# Patient Record
Sex: Male | Born: 1945 | Race: White | Hispanic: No | Marital: Married | Smoking: Former smoker
Health system: Southern US, Academic
[De-identification: ages and names within clinical notes are randomized; demographics above are authoritative.]

## PROBLEM LIST (undated history)

## (undated) DIAGNOSIS — G8929 Other chronic pain: Secondary | ICD-10-CM

## (undated) DIAGNOSIS — K269 Duodenal ulcer, unspecified as acute or chronic, without hemorrhage or perforation: Secondary | ICD-10-CM

## (undated) DIAGNOSIS — Z87898 Personal history of other specified conditions: Secondary | ICD-10-CM

## (undated) DIAGNOSIS — M549 Dorsalgia, unspecified: Secondary | ICD-10-CM

## (undated) DIAGNOSIS — I341 Nonrheumatic mitral (valve) prolapse: Secondary | ICD-10-CM

## (undated) DIAGNOSIS — I4891 Unspecified atrial fibrillation: Secondary | ICD-10-CM

## (undated) HISTORY — PX: HX HERNIA REPAIR: SHX51

## (undated) HISTORY — DX: Other chronic pain: G89.29

## (undated) HISTORY — PX: LAMINECTOMY: SHX219

## (undated) HISTORY — DX: Personal history of other specified conditions: Z87.898

## (undated) HISTORY — DX: Duodenal ulcer, unspecified as acute or chronic, without hemorrhage or perforation: K26.9

## (undated) HISTORY — DX: Unspecified atrial fibrillation (CMS HCC): I48.91

## (undated) HISTORY — PX: HX VASECTOMY: SHX75

## (undated) HISTORY — DX: Dorsalgia, unspecified: M54.9

---

## 2000-04-01 ENCOUNTER — Inpatient Hospital Stay (HOSPITAL_COMMUNITY): Payer: Self-pay

## 2007-08-15 HISTORY — PX: COLONOSCOPY: WVUENDOPRO10

## 2015-12-30 ENCOUNTER — Other Ambulatory Visit (FREE_STANDING_LABORATORY_FACILITY): Payer: Self-pay | Admitting: EXTERNAL

## 2015-12-30 ENCOUNTER — Encounter (FREE_STANDING_LABORATORY_FACILITY)
Admit: 2015-12-30 | Discharge: 2015-12-30 | Disposition: A | Discharge status: Home / Self Care | Payer: Self-pay | Attending: EXTERNAL | Admitting: EXTERNAL

## 2015-12-31 ENCOUNTER — Inpatient Hospital Stay (HOSPITAL_COMMUNITY): Admitting: CRITICAL CARE

## 2015-12-31 ENCOUNTER — Encounter (HOSPITAL_COMMUNITY)
Admission: EM | Disposition: A | Discharge status: Home / Self Care | Payer: Self-pay | Source: Other Acute Inpatient Hospital | Attending: Internal Medicine

## 2015-12-31 ENCOUNTER — Inpatient Hospital Stay
Admission: EM | Admit: 2015-12-31 | Discharge: 2016-01-04 | DRG: 378 | Disposition: A | Discharge status: Home / Self Care | Payer: Medicare Other | Source: Other Acute Inpatient Hospital | Attending: Internal Medicine | Admitting: Internal Medicine

## 2015-12-31 ENCOUNTER — Inpatient Hospital Stay (HOSPITAL_COMMUNITY): Payer: Medicare Other

## 2015-12-31 DIAGNOSIS — Z4682 Encounter for fitting and adjustment of non-vascular catheter: Secondary | ICD-10-CM

## 2015-12-31 DIAGNOSIS — G8929 Other chronic pain: Secondary | ICD-10-CM | POA: Diagnosis present

## 2015-12-31 DIAGNOSIS — F0390 Unspecified dementia without behavioral disturbance: Secondary | ICD-10-CM | POA: Diagnosis present

## 2015-12-31 DIAGNOSIS — D62 Acute posthemorrhagic anemia: Secondary | ICD-10-CM

## 2015-12-31 DIAGNOSIS — K922 Gastrointestinal hemorrhage, unspecified: Secondary | ICD-10-CM

## 2015-12-31 DIAGNOSIS — D6959 Other secondary thrombocytopenia: Secondary | ICD-10-CM | POA: Diagnosis present

## 2015-12-31 DIAGNOSIS — K264 Chronic or unspecified duodenal ulcer with hemorrhage: Principal | ICD-10-CM | POA: Diagnosis present

## 2015-12-31 DIAGNOSIS — F329 Major depressive disorder, single episode, unspecified: Secondary | ICD-10-CM | POA: Diagnosis present

## 2015-12-31 DIAGNOSIS — J929 Pleural plaque without asbestos: Secondary | ICD-10-CM

## 2015-12-31 DIAGNOSIS — K260 Acute duodenal ulcer with hemorrhage: Secondary | ICD-10-CM

## 2015-12-31 DIAGNOSIS — E876 Hypokalemia: Secondary | ICD-10-CM | POA: Diagnosis not present

## 2015-12-31 DIAGNOSIS — E785 Hyperlipidemia, unspecified: Secondary | ICD-10-CM | POA: Diagnosis present

## 2015-12-31 DIAGNOSIS — I471 Supraventricular tachycardia: Secondary | ICD-10-CM | POA: Diagnosis not present

## 2015-12-31 DIAGNOSIS — Z87891 Personal history of nicotine dependence: Secondary | ICD-10-CM

## 2015-12-31 DIAGNOSIS — I341 Nonrheumatic mitral (valve) prolapse: Secondary | ICD-10-CM | POA: Diagnosis present

## 2015-12-31 DIAGNOSIS — D649 Anemia, unspecified: Secondary | ICD-10-CM | POA: Diagnosis present

## 2015-12-31 DIAGNOSIS — R079 Chest pain, unspecified: Secondary | ICD-10-CM

## 2015-12-31 DIAGNOSIS — N400 Enlarged prostate without lower urinary tract symptoms: Secondary | ICD-10-CM

## 2015-12-31 DIAGNOSIS — M549 Dorsalgia, unspecified: Secondary | ICD-10-CM | POA: Diagnosis present

## 2015-12-31 DIAGNOSIS — K567 Ileus, unspecified: Secondary | ICD-10-CM | POA: Diagnosis not present

## 2015-12-31 DIAGNOSIS — Z791 Long term (current) use of non-steroidal anti-inflammatories (NSAID): Secondary | ICD-10-CM

## 2015-12-31 HISTORY — DX: Nonrheumatic mitral (valve) prolapse: I34.1

## 2015-12-31 LAB — VENOUS BLOOD GAS/LACTATE
%FIO2 (VENOUS): 40 %
%FIO2 (VENOUS): 30 %
%FIO2 (VENOUS): 30 %
BASE DEFICIT: 0.2 mmol/L (ref ?–3.0)
BASE EXCESS: 1 mmol/L (ref ?–3.0)
BASE EXCESS: 2.2 mmol/L (ref ?–3.0)
BICARBONATE (VENOUS): 0 mmol/L — ABNORMAL LOW (ref 22.0–26.0)
BICARBONATE (VENOUS): 26.2 mmol/L — ABNORMAL HIGH (ref 22.0–26.0)
LACTATE: 1.4 mmol/L — ABNORMAL HIGH (ref 0.0–1.3)
LACTATE: 1.1 mmol/L (ref 0.0–1.3)
PCO2 (VENOUS): 48 mmHg (ref 41.00–51.00)
PCO2 (VENOUS): 33 mmHg — ABNORMAL LOW (ref 41.00–51.00)
PH (VENOUS): 7.36 (ref 7.31–7.41)
PH (VENOUS): 7.49 — ABNORMAL HIGH (ref 7.31–7.41)
PO2 (VENOUS): 18 mmHg — CL (ref 35.0–50.0)
PO2 (VENOUS): 42 mmHg (ref 35.0–50.0)

## 2015-12-31 LAB — H & H
HCT: 24.7 % — ABNORMAL LOW (ref 36.7–47.0)
HGB: 8.5 g/dL — ABNORMAL LOW (ref 12.5–16.3)

## 2015-12-31 LAB — CBC WITH DIFF
BASOPHIL #: 0.04 x10ˆ3/uL (ref 0.00–0.20)
BASOPHIL %: 1 %
BASOPHIL %: 1 %
EOSINOPHIL #: 0.24 x10ˆ3/uL (ref 0.00–0.50)
EOSINOPHIL %: 3 %
HCT: 16.9 % — ABNORMAL LOW (ref 36.7–47.0)
HCT: 25.7 % — ABNORMAL LOW (ref 36.7–47.0)
HGB: 5.9 g/dL — CL (ref 12.5–16.3)
HGB: 8.9 g/dL — ABNORMAL LOW (ref 12.5–16.3)
LYMPHOCYTE #: 1.84 x10ˆ3/uL (ref 1.00–4.80)
LYMPHOCYTE %: 22 %
MCH: 30 pg (ref 27.4–33.0)
MCH: 29.5 pg (ref 27.4–33.0)
MCH: 29.5 pg (ref 27.4–33.0)
MCHC: 34.9 g/dL (ref 32.5–35.8)
MCHC: 34.6 g/dL (ref 32.5–35.8)
MCV: 86.2 fL (ref 78.0–100.0)
MCV: 85.4 fL (ref 78.0–100.0)
MONOCYTE #: 0.75 x10?3/uL (ref 0.30–1.00)
MONOCYTE %: 9 %
MPV: 10 fL (ref 7.5–11.5)
MPV: 8.7 fL (ref 7.5–11.5)
NEUTROPHIL #: 5.48 x10ˆ3/uL (ref 1.50–7.70)
NEUTROPHIL %: 66 %
PLATELETS: 53 x10ˆ3/uL — ABNORMAL LOW (ref 140–450)
PLATELETS: 85 x10ˆ3/uL — ABNORMAL LOW (ref 140–450)
RBC: 1.97 x10ˆ6/uL — ABNORMAL LOW (ref 4.06–5.63)
RBC: 3.01 10*6/uL — ABNORMAL LOW (ref 4.06–5.63)
RBC: 3.01 x10ˆ6/uL — ABNORMAL LOW (ref 4.06–5.63)
RDW: 14.9 % (ref 12.0–15.0)
RDW: 14.5 % (ref 12.0–15.0)
WBC: 7.4 x10ˆ3/uL (ref 3.5–11.0)
WBC: 8.3 x10ˆ3/uL (ref 3.5–11.0)

## 2015-12-31 LAB — MANUAL DIFFERENTIAL (CELLAVISION)
BANDS: 1 % (ref 0–15)
BASOPHIL #: 0 x10ˆ3/uL (ref 0.00–0.20)
BASOPHIL %: 0 %
EOSINOPHIL #: 0 x10ˆ3/uL (ref 0.00–0.50)
EOSINOPHIL %: 0 %
LYMPHOCYTE #: 0.64 x10ˆ3/uL — ABNORMAL LOW (ref 1.00–4.80)
LYMPHOCYTE %: 9 %
MONOCYTE #: 0 x10ˆ3/uL — ABNORMAL LOW (ref 0.30–1.00)
MONOCYTE %: 0 %
NEUTROPHIL #: 6.76 x10ˆ3/uL (ref 1.50–7.70)
NEUTROPHIL %: 91 %

## 2015-12-31 LAB — PHOSPHORUS: PHOSPHORUS: 2.1 mg/dL — ABNORMAL LOW (ref 2.3–4.0)

## 2015-12-31 LAB — HEPATIC FUNCTION PANEL
ALBUMIN: 2.6 g/dL — ABNORMAL LOW (ref 3.4–4.8)
ALKALINE PHOSPHATASE: 25 U/L (ref <150)
ALT (SGPT): 15 U/L (ref <55)
AST (SGOT): 24 U/L (ref 8–48)
BILIRUBIN DIRECT: 0.5 mg/dL — ABNORMAL HIGH (ref <0.3)
BILIRUBIN TOTAL: 1.1 mg/dL (ref 0.3–1.3)
PROTEIN TOTAL: 4 g/dL — ABNORMAL LOW (ref 6.0–8.0)

## 2015-12-31 LAB — VENOUS BLOOD GAS
%FIO2 (VENOUS): 40 %
BASE DEFICIT: 1.9 mmol/L (ref ?–3.0)
BICARBONATE (VENOUS): 21.7 mmol/L — ABNORMAL LOW (ref 22.0–26.0)
PCO2 (VENOUS): 35 mmHg — ABNORMAL LOW (ref 41.00–51.00)
PH (VENOUS): 7.41 (ref 7.31–7.41)
PO2 (VENOUS): 20 mmHg — ABNORMAL LOW (ref 35.0–50.0)

## 2015-12-31 LAB — BASIC METABOLIC PANEL
ANION GAP: 5 mmol/L (ref 4–13)
BUN/CREA RATIO: 34 — ABNORMAL HIGH (ref 6–22)
BUN: 26 mg/dL — ABNORMAL HIGH (ref 8–25)
CALCIUM: 7.4 mg/dL — ABNORMAL LOW (ref 8.5–10.4)
CHLORIDE: 112 mmol/L — ABNORMAL HIGH (ref 96–111)
CO2 TOTAL: 23 mmol/L (ref 22–32)
CREATININE: 0.76 mg/dL (ref 0.62–1.27)
ESTIMATED GFR: >59 mL/min/1.73mˆ2 (ref >59)
GLUCOSE: 137 mg/dL (ref 65–139)
POTASSIUM: 4.1 mmol/L (ref 3.5–5.1)
SODIUM: 140 mmol/L (ref 136–145)

## 2015-12-31 LAB — MRSA COLONIZATION SCREEN, PCR: MRSA COLONIZATION SCREEN: NEGATIVE

## 2015-12-31 LAB — PT/INR
INR: 1.22 — ABNORMAL HIGH (ref 0.80–1.20)
PROTHROMBIN TIME: 13.8 s — ABNORMAL HIGH (ref 9.0–13.6)

## 2015-12-31 LAB — MAGNESIUM: MAGNESIUM: 1.4 mg/dL — ABNORMAL LOW (ref 1.6–2.5)

## 2015-12-31 LAB — POC BLOOD GLUCOSE (RESULTS)
GLUCOSE, POC: 141 mg/dL — ABNORMAL HIGH (ref 70–105)
GLUCOSE, POC: 89 mg/dL (ref 70–105)

## 2015-12-31 LAB — LIPASE: LIPASE: 18 U/L (ref 10–80)

## 2015-12-31 LAB — TROPONIN-I: TROPONIN I: 9 ng/L (ref 0–30)

## 2015-12-31 SURGERY — IR EMBOLIZATION/BODY

## 2015-12-31 MED ORDER — LIDOCAINE HCL 10 MG/ML (1 %) INJECTION SOLUTION
Freq: Once | INTRAMUSCULAR | Status: DC | PRN
Start: 2015-12-31 — End: 2015-12-31
  Administered 2015-12-31: 5 mL via INTRADERMAL

## 2015-12-31 MED ORDER — IOPAMIDOL 300 MG IODINE/ML (61 %) INTRAVENOUS SOLUTION
Freq: Once | INTRAVENOUS | Status: DC | PRN
Start: 2015-12-31 — End: 2015-12-31
  Administered 2015-12-31: 160 mL via INTRA_ARTERIAL

## 2015-12-31 MED ORDER — SODIUM CHLORIDE 0.9 % IV BOLUS
40.00 mL | INJECTION | Freq: Once | Status: AC | PRN
Start: 2015-12-31 — End: 2015-12-31

## 2015-12-31 MED ORDER — MIDAZOLAM 1 MG/ML INJECTION SOLUTION
INTRAMUSCULAR | Status: AC
Start: 2015-12-31 — End: 2015-12-31
  Filled 2015-12-31: qty 2

## 2015-12-31 MED ORDER — FENTANYL (PF) 50 MCG/ML INJECTION SOLUTION
INTRAMUSCULAR | Status: AC
Start: 2015-12-31 — End: 2015-12-31
  Administered 2015-12-31: 50 ug via INTRAVENOUS
  Filled 2015-12-31: qty 2

## 2015-12-31 MED ORDER — LIDOCAINE HCL 10 MG/ML (1 %) INJECTION SOLUTION
INTRAMUSCULAR | Status: AC
Start: 2015-12-31 — End: 2015-12-31
  Filled 2015-12-31: qty 20

## 2015-12-31 MED ORDER — FENTANYL (PF) 50 MCG/ML INJECTION SOLUTION
50.0000 ug | Freq: Once | INTRAMUSCULAR | Status: AC
Start: 2015-12-31 — End: 2015-12-31

## 2015-12-31 MED ORDER — MIDAZOLAM 1 MG/ML IN 0.9 % SODIUM CHLORIDE INTRAVENOUS
0.5000 mg/h | INTRAVENOUS | Status: DC
Start: 2015-12-31 — End: 2015-12-31
  Administered 2015-12-31: 3 mg/h via INTRAVENOUS
  Administered 2015-12-31: 0 mg/h via INTRAVENOUS
  Administered 2015-12-31: 2 mg/h via INTRAVENOUS
  Filled 2015-12-31: qty 100

## 2015-12-31 MED ORDER — SODIUM CHLORIDE 0.9 % (FLUSH) INJECTION SYRINGE
2.0000 mL | INJECTION | Freq: Three times a day (TID) | INTRAMUSCULAR | Status: DC
Start: 2015-12-31 — End: 2016-01-04
  Administered 2015-12-31: 0 mL
  Administered 2015-12-31 – 2016-01-03 (×7): 2 mL
  Administered 2016-01-03 – 2016-01-04 (×2): 0 mL
  Administered 2016-01-04: 2 mL

## 2015-12-31 MED ORDER — MAGNESIUM SULFATE 2 GRAM/50 ML (4 %) IN WATER INTRAVENOUS PIGGYBACK
2.0000 g | INJECTION | Freq: Once | INTRAVENOUS | Status: AC
Start: 2015-12-31 — End: 2015-12-31
  Administered 2015-12-31: 2 g via INTRAVENOUS
  Filled 2015-12-31: qty 50

## 2015-12-31 MED ORDER — FENTANYL (PF) 50 MCG/ML INJECTION SOLUTION
25.0000 ug | INTRAMUSCULAR | Status: DC | PRN
Start: 2015-12-31 — End: 2016-01-01

## 2015-12-31 MED ORDER — MIDAZOLAM 1 MG/ML IN 0.9 % SODIUM CHLORIDE INTRAVENOUS
0.5000 mg/h | INTRAVENOUS | Status: DC
Start: 2015-12-31 — End: 2016-01-01
  Administered 2015-12-31: 0.5 mg/h via INTRAVENOUS
  Administered 2015-12-31: 0 mg/h via INTRAVENOUS
  Administered 2015-12-31: 0.2 mg/h via INTRAVENOUS
  Administered 2015-12-31: 0.5 mg/h via INTRAVENOUS
  Administered 2016-01-01: 0 mg/h via INTRAVENOUS

## 2015-12-31 MED ORDER — FENTANYL (PF) 50 MCG/ML INJECTION SOLUTION
INTRAMUSCULAR | Status: AC
Start: 2015-12-31 — End: 2015-12-31
  Filled 2015-12-31: qty 2

## 2015-12-31 MED ORDER — SODIUM CHLORIDE 0.9 % INTRAVENOUS SOLUTION
Freq: Two times a day (BID) | INTRAVENOUS | Status: AC
Start: 2015-12-31 — End: 2016-01-02
  Filled 2015-12-31 (×7): qty 20

## 2015-12-31 MED ORDER — SODIUM CHLORIDE 0.9 % (FLUSH) INJECTION SYRINGE
2.0000 mL | INJECTION | INTRAMUSCULAR | Status: DC | PRN
Start: 2015-12-31 — End: 2016-01-04

## 2015-12-31 MED ORDER — FENTANYL (PF) 50 MCG/ML INTRAVENOUS SOLUTION
0.20 ug/kg/h | INTRAVENOUS | Status: DC
Start: 2015-12-31 — End: 2016-01-01
  Administered 2015-12-31: 2 ug/kg/h via INTRAVENOUS
  Administered 2015-12-31: 0.6 ug/kg/h via INTRAVENOUS
  Administered 2015-12-31: 2.5 ug/kg/h via INTRAVENOUS
  Administered 2015-12-31: 3 ug/kg/h via INTRAVENOUS
  Administered 2015-12-31 (×2): 2.5 ug/kg/h via INTRAVENOUS
  Administered 2015-12-31 (×2): 2.2 ug/kg/h via INTRAVENOUS
  Administered 2015-12-31: 1.7 ug/kg/h via INTRAVENOUS
  Administered 2015-12-31: 1.5 ug/kg/h via INTRAVENOUS
  Administered 2015-12-31: 1 ug/kg/h via INTRAVENOUS
  Administered 2015-12-31: 2 ug/kg/h via INTRAVENOUS
  Administered 2016-01-01: 1 ug/kg/h via INTRAVENOUS
  Administered 2016-01-01: 1.4 ug/kg/h via INTRAVENOUS
  Administered 2016-01-01: 1.6 ug/kg/h via INTRAVENOUS
  Administered 2016-01-01: 0.8 ug/kg/h via INTRAVENOUS
  Administered 2016-01-01: 1.8 ug/kg/h via INTRAVENOUS
  Administered 2016-01-01: 0 ug/kg/h via INTRAVENOUS
  Administered 2016-01-01: 1.2 ug/kg/h via INTRAVENOUS
  Administered 2016-01-01: 0.6 ug/kg/h via INTRAVENOUS
  Filled 2015-12-31 (×2): qty 50

## 2015-12-31 MED ORDER — PANTOPRAZOLE 40 MG TABLET,DELAYED RELEASE
40.0000 mg | DELAYED_RELEASE_TABLET | ORAL | Status: DC
Start: 2016-01-03 — End: 2016-01-04
  Administered 2016-01-03 – 2016-01-04 (×2): 40 mg via ORAL
  Filled 2015-12-31 (×2): qty 1

## 2015-12-31 MED ORDER — FENTANYL (PF) 50 MCG/ML INTRAVENOUS SOLUTION
INTRAVENOUS | Status: AC
Start: 2015-12-31 — End: 2015-12-31
  Administered 2015-12-31: 0.2 ug/kg/h via INTRAVENOUS
  Filled 2015-12-31: qty 50

## 2015-12-31 MED ORDER — LACTATED RINGERS INTRAVENOUS SOLUTION
INTRAVENOUS | Status: DC
Start: 2015-12-31 — End: 2016-01-02

## 2015-12-31 MED ORDER — SODIUM CHLORIDE 0.9 % INTRAVENOUS SOLUTION
80.0000 mg | Freq: Once | INTRAVENOUS | Status: AC
Start: 2015-12-31 — End: 2015-12-31
  Administered 2015-12-31: 80 mg via INTRAVENOUS
  Filled 2015-12-31: qty 20

## 2015-12-31 MED ADMIN — nystatin 100,000 unit/gram topical powder: INTRAVENOUS | @ 13:00:00 | NDC 00574200802

## 2015-12-31 MED ADMIN — dextrose 50 % in water (D50W) intravenous syringe: @ 14:00:00

## 2015-12-31 SURGICAL SUPPLY — 24 items
BAG WASTE 48IN 72IN SPIKE FEMALE LL RFLX VALVE LRG BORE MRT DSPSL MSR DISP 1400ML (MISCELLANEOUS PT CARE ITEMS) ×1 IMPLANT
BAG WASTE SALINE/CONTRAST_K1000576 10/BX (MISCELLANEOUS PT CARE ITEMS) ×1
CATH ANGIO 5FR C2 CURVE 65CM GLIDECATH 2 BRD VSCR SS HDRPH ACPT .038IN GW (DIAGNOSTIC) ×1 IMPLANT
CATH ANGIO 5FR C2 CURVE 65CM G_LIDECATH 2 BRD VSCR SS HDRPH (DIAGNOSTIC) ×1
CATH PROGREAT 2.8FR .027IN 130_CM RADOPQ INTVN MICRO HDRPH (GUIDING) ×1
CATH PROGREAT 2.8FR 70CM 130CM RADOPQ COAX KINK RST INTVN MICRO PERI DIST TUNG PTFE HDRPH ACPT .021 (GUIDING) ×1 IMPLANT
COIL EMBL 3MM 14CM NST 14.9 LOOP PLAT FBR STRL LF  .018IN CATH (COIL) ×7 IMPLANT
COIL EMBL 3MM 7CM NST 7.4 LOOP PLAT FBR STRL LF  ACPT .018IN CATH (COIL) ×4 IMPLANT
DEVICE CLSR MYNXGRIP 5FR BAL CATH INTGR SEAL LOCK SYRG ATRAUMA TIP GRY 10ML VAS STRL LF  DISP (CLOSURE) ×1 IMPLANT
DEVICE FLOW CONTROL FLOWSWITCH 1 WY HI PRESS 1050 PSI (Connecting Tubes/Misc) ×1 IMPLANT
DEVICE FLOW CONTROL FLSWTCH 1_WY HI PRESS 1050 PSI (Connecting Tubes/Misc) ×1
GW .035IN 15CM 145CM FLPY FLX_P FIX COR STD SHFT SS PTFE (WIRE) ×1
GW .035IN 15CM 145CM FLX TP SS PTFE VAS BNTSN TAPER (WIRE) ×1 IMPLANT
GW GLDWR .035IN 3CM 150CM FLXB ANG TIP RADOPQ KINK RST NITINOL TUNG POLYUR HDRPH VAS STD (WIRE) ×1 IMPLANT
GW GLDWR .035IN 3CM 150CM RADO_Q STD SHFT FLXB COR TO TIP (WIRE) ×1
GW GLDWR GT .018IN 25CM 180CM_IP ANG TUNG NITINOL POLYUR (WIRE) ×1
GW GLDWR GT .018IN 2CM 180CM FLX TP RADOPQ KINK RST NITINOL TUNG POLYUR HDRPH VAS 60D 90D 25CM STD (WIRE) ×1 IMPLANT
KIT MICROPUNCTURE 4X10 G47940_COAX (NEEDLES & SYRINGE SUPPLIES) ×1
KIT SYRINGE CUSTOM_K1205494 (NEEDLES & SYRINGE SUPPLIES) ×2 IMPLANT
MICROPUNCTURE 4FR 21GA .018IN SS SET ACCESS 40CM 10CM COAX CATH GW NEEDLE TIP STRL DISP (NEEDLES & SYRINGE SUPPLIES) ×1 IMPLANT
SHEATH INTROD 10CM 5FR PINN .035IN PERI SS SNAPON DIL LOCK KINK RST SMOOTH TRNS SPRG COIL GW 2.5CM (INTRODUCER) ×1 IMPLANT
SHEATH INTROD PINNACLE 5FRX10_10/BX RSS501 (INTRODUCER) ×1
SYRINGE ANGIO LF  MRK 7 ARTERION (NEEDLES & SYRINGE SUPPLIES) ×1 IMPLANT
SYRINGE ANGIO LF MRK 7 ARTERI_ON (NEEDLES & SYRINGE SUPPLIES) ×1

## 2015-12-31 NOTE — Care Management Notes (Signed)
[facility]  Care Management Initial Evaluation    Patient Name: Henry LiBruce Barber  Date of Birth: 07/06/1946  Sex: male  Date/Time of Admission: 12/31/2015  9:20 AM  Room/Bed: 17/A  Payor: MEDICARE / Plan: MEDICARE PART A / Product Type: Medicare /   PCP: No Established Pcp    Pharmacy Info:   Preferred Pharmacy     [id number] Christie Beckers- [place], [place] - [address] [facility] [address]    [address] [place] [id number]    Phone: [phone] Fax: [phone]    Not a 24 hour pharmacy; exact hours not known    [id number] Lafe Garin- [place], [place] - [phone]    [address] [place] [id number]    Phone: [phone] Fax: [phone]    Not a 24 hour pharmacy; exact hours not known        Emergency Contact Info:   Extended Emergency Contact Information  Primary Emergency Contact: Henry RedDRURY, Henry  Address: [address]           [place], [place] [id number] [place]  Home Phone: [phone]  Relation: Wife    History:   [name] is a 70 y.o., male, admitted GI Bleeding    Height/Weight: 193 cm (6\' 4" ) / 80.9 kg (178 lb 5.6 oz)     LOS: 0 days   Admitting Diagnosis: GI bleeding [K92.2]    Assessment:      12/31/15 1842   Assessment Details   Assessment Type Admission   Date of Care Management Update 12/31/15   Date of Next DCP Update 01/03/16   Readmission   Is this a readmission? No   Care Management Plan   Discharge Planning Status initial meeting   Projected Discharge Date 01/05/16   CM will evaluate for rehabilitation potential yes   Patient choice offered to patient/family no   Form for patient choice reviewed/signed and on chart no   Patient aware of possible cost for ambulance transport?  No   Discharge Needs Assessment   Equipment Currently Used at Home none   Equipment Needed After Discharge none   Discharge Facility/Level Of Care Needs Home (Patient/Family Member/other)(code 1)   Transportation Available car;family or friend will  provide   Referral Information   Admission Type inpatient   Address Verified verified-no changes   Arrived From acute hospital, other   [facility]   Insurance Verified verified-no change   ADVANCE DIRECTIVES   Does the Patient have an Advance Directive? Yes, Patient Does Have Advance Directive for Healthcare Treatment   Type of Advance Directive Completed 1   Copy of Advance Directives in Chart? 0   Name of MPOA or Healthcare Surrogate [name] (spouse)   Phone Number of MPOA or Healthcare Surrogate [phone]   Employment/Financial   Patient has Prescription Coverage?  Yes       Name of Insurance Coverage for Medications [employer]BS   Financial Concerns none   Living Environment   Select an age group to open "lives with" row.  Adult   Lives With spouse   Living Arrangements house   Able to Return to Prior Living Arrangements yes   Home Safety   Home Assessment: Stairs in Home   Home Accessibility no concerns   Legal Issues   Do you have a court appointed guardian/conservator? No     Pt remains intubated in the ICU.  Initial assessment completed with pt's spouse [name] and two sons [name] and [name].  Pt lives with spouse in a two story home in [place].  Pt also has a one story home in [place].  Spouse had not decided on which home to return to upon d/c.  No previous Home Health or DME.  Pt will have transportation upon d/c.  [facility] [place] [facility] [place]  Health Care Surrogate initiated and placed on chart appointing pt's spouse [name] [phone].  [name] decline appointing a Arts administrator. Pt will have transportation upon d/c.  Pt's PCP is [facility] [facility].    Discharge Plan:  Home (Patient/Family Member/other) (code 1)  D/C needs unknown due to pt's current condition.  Care Management will arrange as appropriate.    The patient will continue to be evaluated for developing discharge needs.     Case Manager: Sharrie Rothman, MSW  Phone: [phone]

## 2015-12-31 NOTE — Care Management Notes (Signed)
MSW contacted by bedside RN to establish health care surrogate.  MSW attempted to meet with pt and pt's spouse at bedside.  Pt currently in IR and no family present at bedside or waiting area.  MSW attempted to contact spouse by phone, received no answer.  MSW following.

## 2015-12-31 NOTE — Nurses Notes (Signed)
Pt returned from IR status post procedure Dressing to right groin clean,dry and intact no s/s of bleeding or hematoma. Will continue to monitor pt.

## 2015-12-31 NOTE — Nurses Notes (Signed)
[facility] crew. Patient ventilated via ET tube. Patient transferred from stretcher to bed and placed on monitor. Foley and OG tube placed. Labs drawn and sent. MICU at bedside. Will monitor.

## 2015-12-31 NOTE — Care Plan (Signed)
Problem: General Plan of Care(Adult,OB)  Goal: Plan of Care Review(Adult,OB)  The patient and/or their representative will communicate an understanding of their plan of care   Outcome: Ongoing (see interventions/notes)  Discharge Plan:  Home (Patient/Family Member/other) (code 1)  D/C needs unknown due to pt's current condition. Care Management will arrange as appropriate.    The patient will continue to be evaluated for developing discharge needs.     Case Manager: Jossiah Smoak L Karan Inclan, MSW  Phone: [phone]

## 2015-12-31 NOTE — Nurses Notes (Signed)
Patient agitated and banging on rails. Appears anxious. Patient maxed on fentanyl drip at 153mcg/kg/min. Notified Dr. Sibyl Parrhapman and orders received to restart versed drip. Will monitor.

## 2015-12-31 NOTE — Care Plan (Signed)
Problem: Ventilation, Mechanical Invasive (Adult)  Prevent and manage potential problems including:1. artificial airway induced skin/tissue breakdown2. gastritis/stress ulcer3. immobility4. inability to wean5. malnutrition6. mechanical dysfunction7. situational response8. ventilator- induced lung injury  Goal: Signs and Symptoms of Listed Potential Problems Will be Absent or Manageable (Ventilation, Mechanical Invasive)  Signs and symptoms of listed potential problems will be absent or manageable by discharge/transition of care (reference Ventilation, Mechanical Invasive (Adult) CPG).  Outcome: Ongoing (see interventions/notes)  Patient currently intubated and on mechanical ventilator on SIMV PRVC.Patient HBG was 5.9 and patient was given blood prior to being taken to IR. Patient has returned from IR and is stable at this time. No BAL per MICU service. Will continue to wean as tolerated by patient.        VENTILATOR - SIMV(PRVC)PS / APV-SIMV CONTINUOUS    Discontinue   Duration: Until Specified    Priority: Routine        Question Answer Comment   FIO2 (%) 40     Peep(cm/H2O) 5     Rate(bpm) 16     Tidal Volume(mls) 550     Pressure Support(cm/H2O) 12     Indications IMPROVE DISTRIBUTION OF VENTILATION           12/31/15 [id number]

## 2015-12-31 NOTE — H&P (Addendum)
[facility]  MICU History and Physical    Barber,Henry, 70 y.o. male  Date of Admission:  12/31/2015  Date of service:   Date of Birth:  Jan 26, 1946    PCP: No Established Pcp    Information Obtained from: spouse and history reviewed via medical record  Chief Complaint: Upper GI bleeding     HPI:     [name] is a 70 y.o., White male who has no significant PMH.  The patient presented to [facility] with hx of syncope, melena and hematemesis. Had massive blood transfusion in the outside facility and had endoscopy with findings consistent with posterior duodenal ulcer with visible pulsatile artery. endo clip and injection of epi done during the endoscopy. The patient was intubated in the outside facility for airway protection and transferred here for higher level of care with possible IR intervention.   According to the wife the patient has been healthy and they just came back from a trip to [place] with no complaint.       PMH:   Significant for arthritis with persistent NSAID use.  HLD  BPH    No other known significant surgical or medical hx.     ROS: not fully obtained - the patient is intubated.   Constitutional: Negative for fever.   Cardiovascular: Negative for chest pain.   Respiratory: Negative for cough. NO SOB.   Gastrointestinal: Negative for nausea, vomiting and abdominal pain. No diarrhea, constipation or hematochezia  Genitourinary: Negative for dysuria, urgency, frequency, or hematuria.   Musculoskeletal: Negative for neck pain and back pain.   Neurological: Negative for dizziness and loss of consciousness.  No numbness, tingling, or other sensation changed.  Psych: Denies anxiety or depression.  All other systems reviewed and are negative.         Medications Prior to Admission     NSAID           Current Facility-Administered Medications:  fentaNYL (SUBLIMAZE) 50 mcg/mL (tot vol 50 mL) infusion 0.2 mcg/kg/hr (Ideal) Intravenous Continuous   LR premix infusion  Intravenous  Continuous   magnesium sulfate 2 G in SW 50 mL premix IVPB 2 g Intravenous Once   NS bolus infusion 40 mL 40 mL Intravenous Once PRN   NS bolus infusion 40 mL 40 mL Intravenous Once PRN   NS flush syringe 2 mL Intracatheter Q8HRS   And      NS flush syringe 2-6 mL Intracatheter Q1 MIN PRN   pantoprazole (PROTONIX) 80 mg in NS 100 mL infusion  Intravenous Q12H   And      [START ON 01/03/2016] pantoprazole (PROTONIX) delayed release tablet 40 mg Oral Q24H       Allergies not on file    Family History  The patent is intubated- no known contributory hx     Social History:  Not a smoker, drinks occ.    Social History     Social History    Marital status: Married     Spouse name: N/A    Number of children: N/A    Years of education: N/A     Occupational History    Not on file.     Social History Main Topics    Smoking status: Not on file    Smokeless tobacco: Not on file    Alcohol use Not on file    Drug use: Not on file    Sexual activity: Not on file     Other Topics Concern  Not on file     Social History Narrative           EXAM:  Temperature: 37.3 C (99.2 F)  Heart Rate: 83  BP (Non-Invasive): (!) 107/56  Respiratory Rate: (!) 9  SpO2-1: 100 %  Pain Score (Numeric, Faces): Other    Physical Exam:  General: The patient is currently intubated.   Eyes: Conjunctiva clear, Pupils equal and round, reactive to light .   HENT: Head atraumatic and normocephalic, Mouth mucous membranes moist.  Neck: No JVD, supple, symmetrical, trachea midline and no adenopathy or thyromegaly  Lungs: Clear to auscultation bilaterally.   Cardiovascular: regular rate and rhythm, S1, S2 normal, no murmur, click, rub or gallop  Abdomen: Soft, non-tender, Bowel sounds normal, non-distended  Genito-urinary: No rash or discharge.   Extremities: extremities normal, atraumatic, no cyanosis or edema  Skin: Skin warm and dry and No rashes  Neurologic: Grossly normal, CN II - XII grossly intact , Alert and oriented x3  Lymphatics: No  lymphadenopathy  Psychiatric: Affect Normal       Labs:    Lab Results for Last 24 Hours:    Results for orders placed or performed during the hospital encounter of 12/31/15 (from the past 24 hour(s))   BASIC METABOLIC PANEL   Result Value Ref Range    SODIUM 140 136 - 145 mmol/L    POTASSIUM 4.1 3.5 - 5.1 mmol/L    CHLORIDE 112 (H) 96 - 111 mmol/L    CO2 TOTAL 23 22 - 32 mmol/L    ANION GAP 5 4 - 13 mmol/L    CALCIUM 7.4 (L) 8.5 - 10.4 mg/dL    GLUCOSE 478 65 - 295 mg/dL    BUN 26 (H) 8 - 25 mg/dL    CREATININE 6.21 3.08 - 1.27 mg/dL    BUN/CREA RATIO 34 (H) 6 - 22    ESTIMATED GFR >59 >59 mL/min/1.24m2   HEPATIC FUNCTION PANEL   Result Value Ref Range    ALBUMIN 2.6 (L) 3.4 - 4.8 g/dL    ALKALINE PHOSPHATASE 25 <150 U/L    ALT (SGPT) 15 <55 U/L    AST (SGOT) 24 8 - 48 U/L    BILIRUBIN TOTAL 1.1 0.3 - 1.3 mg/dL    BILIRUBIN DIRECT 0.5 (H) <0.3 mg/dL    PROTEIN TOTAL 4.0 (L) 6.0 - 8.0 g/dL   LIPASE   Result Value Ref Range    LIPASE 18 10 - 80 U/L   MAGNESIUM   Result Value Ref Range    MAGNESIUM 1.4 (L) 1.6 - 2.5 mg/dL   PHOSPHORUS   Result Value Ref Range    PHOSPHORUS 2.1 (L) 2.3 - 4.0 mg/dL   PT/INR   Result Value Ref Range    PROTHROMBIN TIME 13.8 (H) 9.0 - 13.6 seconds    INR 1.22 (H) 0.80 - 1.20   TROPONIN-I   Result Value Ref Range    TROPONIN I 9 0 - 30 ng/L   VENOUS BLOOD GAS/LACTATE   Result Value Ref Range    %FIO2 40.0 %    PH 7.36 7.31 - 7.41    PCO2 48.00 41.00 - 51.00 mm/Hg    PO2 18.0 (LL) 35.0 - 50.0 mm/Hg    BASE EXCESS 1.0 -3.0 - 3.0 mmol/L    BASE DEFICIT 0.2 -3.0 - 3.0 mmol/L    BICARBONATE 0.0 (L) 22.0 - 26.0 mmol/L    LACTATE 1.4 (H) 0.0 - 1.3 mmol/L   TYPE AND CROSS  RED CELLS   Result Value Ref Range    UNITS ORDERED 3     SPECIMEN EXPIRATION DATE 01/03/2016     ABO/RH(D) A POSITIVE     ANTIBODY SCREEN NEGATIVE    BPAM PACKED CELL ORDER   Result Value Ref Range    Coding System ISBT128     Unit Number [id number]     BLOOD COMPONENT TYPE LR RBC, Adsol3, 04730     UNIT DIVISION 00     Unit  Dispense Status ALLOCATED     TRANSFUSION STATUS OK TO TRANSFUSE     IS CROSSMATCH Electronically Compatible     Product Code E0382V00     Coding System ISBT128     Unit Number [id number]     BLOOD COMPONENT TYPE LR RBC, Adsol3, 04730     UNIT DIVISION 00     Unit Dispense Status ISSUED     TRANSFUSION STATUS OK TO TRANSFUSE     IS CROSSMATCH Electronically Compatible     Product Code H0865H84     Coding System ISBT128     Unit Number [id number]     BLOOD COMPONENT TYPE LR RBC, Adsol3, 04730     UNIT DIVISION 00     Unit Dispense Status ALLOCATED     TRANSFUSION STATUS OK TO TRANSFUSE     IS CROSSMATCH Electronically Compatible     Product Code M0102V25    POC BLOOD GLUCOSE (RESULTS)   Result Value Ref Range    GLUCOSE, POC 141 (H) 70 - 105 mg/dL   CBC WITH DIFF   Result Value Ref Range    WBC 7.4 3.5 - 11.0 x103/uL    RBC 1.97 (L) 4.06 - 5.63 x106/uL    HGB 5.9 (LL) 12.5 - 16.3 g/dL    HCT 36.6 (L) 44.0 - 47.0 %    MCV 86.2 78.0 - 100.0 fL    MCH 30.0 27.4 - 33.0 pg    MCHC 34.9 32.5 - 35.8 g/dL    RDW 34.7 42.5 - 95.6 %    PLATELETS 53 (L) 140 - 450 x103/uL    MPV 10.0 7.5 - 11.5 fL   VENOUS BLOOD GAS   Result Value Ref Range    %FIO2 40.0 %    BICARBONATE 21.7 (L) 22.0 - 26.0 mmol/L    PCO2 35.00 (L) 41.00 - 51.00 mm/Hg    PH 7.41 7.31 - 7.41    PO2 20.0 (L) 35.0 - 50.0 mm/Hg    BASE DEFICIT 1.9 -3.0 - 3.0 mmol/L   MANUAL DIFFERENTIAL (CELLAVISION)   Result Value Ref Range    BANDS 1 0 - 15 %    MORPHOLOGY       No significant types of abnormal RBC and or Platelet morphology noted in microscopic review.    NEUTROPHIL % 91 %    LYMPHOCYTE % 9 %    MONOCYTE % 0 %    EOSINOPHIL % 0 %    BASOPHIL % 0 %    NEUTROPHIL # 6.76 1.50 - 7.70 x103/uL    LYMPHOCYTE # 0.64 (L) 1.00 - 4.80 x103/uL    MONOCYTE # 0.00 (L) 0.30 - 1.00 x103/uL    EOSINOPHIL # 0.00 0.00 - 0.50 x103/uL    BASOPHIL # 0.00 0.00 - 0.20 x103/uL   PRODUCT: FFP/PLASMA   Result Value Ref Range    Coding System ISBT128     Unit Number  [id number]     BLOOD COMPONENT TYPE THAWED PLASMA  UNIT DIVISION 00     Unit Dispense Status ISSUED     TRANSFUSION STATUS OK TO TRANSFUSE     Product Code E2720V00    PRODUCT: PLATELETS   Result Value Ref Range    Coding System ISBT128     Unit Number [id number]     BLOOD COMPONENT TYPE LR 12810 Irr     UNIT DIVISION 00     Unit Dispense Status ISSUED     TRANSFUSION STATUS OK TO TRANSFUSE     Product Code E3056V00        Imaging Studies:    Endoscopy from outside facility: 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 male with hx of back pain with chronic NSAID use, the patient presented from [facility] s/p endoscopy with posterior duodenal ulcer.     Upper GI Bleeding Secondary To Duodenal Ulcer:   - The patient is currently stable ( MAP > 65 and not tachycardiac), intubated to protect the airway.   - Started on PPI drip and continues IVF.   - The patient received RBC, platelets, and FFP with hb 7 prior to transfer.   - CBC upon admission: Hb 5.9, platelet 53 and  INR 1.2   - 3 units of RBC, 1 platelets and FFP ordered. Will keep trending H&H.   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 Endo clip and injection of epi tried.   - IR consulted and planning for embolization.   - The patient kept intubated for the procedure. Will wean as tolerated.       DVT/PE Prophylaxis: not indicated with bleeding.       Mohammed Alshehri  PGY1-Internal Medicine  [phone]    I saw and examined the patient, personally performed the critical or key portions of the service and discussed patient management with the ICU team including the resident, fellow, nurse and ancillary staff. Patient was examined and I reviewed the resident's note and agree with the history, findings and plan of care. Exceptions are edited/noted above.  Laboratory, hemodynamic, respiratory support, and radiological data were also reviewed and discussed.     I saw and evaluated the patient. I reviewed the resident's note. I agree with the findings and plan of care as documented in the resident's note. Any exceptions/additions are noted above.  Steele BergJohn E. Tyaisha Cullom, M.D.  Critical care time 107 minutes

## 2016-01-01 ENCOUNTER — Encounter (HOSPITAL_COMMUNITY): Payer: Self-pay

## 2016-01-01 ENCOUNTER — Inpatient Hospital Stay (HOSPITAL_COMMUNITY): Payer: Medicare Other

## 2016-01-01 ENCOUNTER — Inpatient Hospital Stay (HOSPITAL_COMMUNITY)

## 2016-01-01 DIAGNOSIS — R000 Tachycardia, unspecified: Secondary | ICD-10-CM

## 2016-01-01 DIAGNOSIS — N400 Enlarged prostate without lower urinary tract symptoms: Secondary | ICD-10-CM

## 2016-01-01 DIAGNOSIS — I491 Atrial premature depolarization: Secondary | ICD-10-CM

## 2016-01-01 DIAGNOSIS — K567 Ileus, unspecified: Secondary | ICD-10-CM

## 2016-01-01 DIAGNOSIS — Z4682 Encounter for fitting and adjustment of non-vascular catheter: Secondary | ICD-10-CM

## 2016-01-01 LAB — BASIC METABOLIC PANEL
ANION GAP: 3 mmol/L — ABNORMAL LOW (ref 4–13)
ANION GAP: 7 mmol/L (ref 4–13)
BUN/CREA RATIO: 24 — ABNORMAL HIGH (ref 6–22)
BUN/CREA RATIO: 22 (ref 6–22)
BUN: 18 mg/dL (ref 8–25)
BUN: 16 mg/dL (ref 8–25)
BUN: 16 mg/dL (ref 8–25)
CALCIUM: 7.8 mg/dL — ABNORMAL LOW (ref 8.5–10.4)
CALCIUM: 7.8 mg/dL — ABNORMAL LOW (ref 8.5–10.4)
CALCIUM: 7.8 mg/dL — ABNORMAL LOW (ref 8.5–10.4)
CHLORIDE: 109 mmol/L (ref 96–111)
CHLORIDE: 108 mmol/L (ref 96–111)
CO2 TOTAL: 26 mmol/L (ref 22–32)
CO2 TOTAL: 24 mmol/L (ref 22–32)
CREATININE: 0.75 mg/dL (ref 0.62–1.27)
CREATININE: 0.74 mg/dL (ref 0.62–1.27)
ESTIMATED GFR: >59 mL/min/1.73mˆ2 (ref >59)
ESTIMATED GFR: >59 mL/min/1.73m?2 (ref >59)
GLUCOSE: 92 mg/dL (ref 65–139)
GLUCOSE: 106 mg/dL (ref 65–139)
POTASSIUM: 3.9 mmol/L (ref 3.5–5.1)
POTASSIUM: 3.6 mmol/L (ref 3.5–5.1)
POTASSIUM: 3.6 mmol/L (ref 3.5–5.1)
SODIUM: 138 mmol/L (ref 136–145)
SODIUM: 139 mmol/L (ref 136–145)

## 2016-01-01 LAB — PRODUCT: PLATELETS - UNITS: UNIT DIVISION: 0

## 2016-01-01 LAB — CBC WITH DIFF
BASOPHIL #: 0.04 x10ˆ3/uL (ref 0.00–0.20)
BASOPHIL %: 0 %
EOSINOPHIL #: 0.62 x10ˆ3/uL — ABNORMAL HIGH (ref 0.00–0.50)
EOSINOPHIL %: 7 %
HCT: 26.2 % — ABNORMAL LOW (ref 36.7–47.0)
HGB: 8.9 g/dL — ABNORMAL LOW (ref 12.5–16.3)
LYMPHOCYTE #: 1.25 x10ˆ3/uL (ref 1.00–4.80)
LYMPHOCYTE %: 14 %
MCH: 29.4 pg (ref 27.4–33.0)
MCHC: 34.1 g/dL (ref 32.5–35.8)
MCV: 86.2 fL (ref 78.0–100.0)
MONOCYTE #: 0.83 x10ˆ3/uL (ref 0.30–1.00)
MONOCYTE %: 9 %
MPV: 8.6 fL (ref 7.5–11.5)
NEUTROPHIL #: 6.52 x10ˆ3/uL (ref 1.50–7.70)
NEUTROPHIL %: 71 %
PLATELETS: 89 x10ˆ3/uL — ABNORMAL LOW (ref 140–450)
RBC: 3.04 x10ˆ6/uL — ABNORMAL LOW (ref 4.06–5.63)
RDW: 14.7 % (ref 12.0–15.0)
WBC: 9.2 x10ˆ3/uL (ref 3.5–11.0)

## 2016-01-01 LAB — BPAM PACKED CELL ORDER
UNIT DIVISION: 0
UNIT DIVISION: 0
UNIT DIVISION: 0

## 2016-01-01 LAB — TYPE AND CROSS RED CELLS - UNITS
ABO/RH(D): A POS
ANTIBODY SCREEN: NEGATIVE
ANTIBODY SCREEN: NEGATIVE
UNITS ORDERED: 3

## 2016-01-01 LAB — VENOUS BLOOD GAS
%FIO2 (VENOUS): 30 %
BASE EXCESS: 1.6 mmol/L (ref ?–3.0)
BICARBONATE (VENOUS): 24.7 mmol/L (ref 22.0–26.0)
PCO2 (VENOUS): 56 mmHg — ABNORMAL HIGH (ref 41.00–51.00)
PH (VENOUS): 7.32 (ref 7.31–7.41)
PO2 (VENOUS): 26 mmHg — ABNORMAL LOW (ref 35.0–50.0)

## 2016-01-01 LAB — H & H
HCT: 27 % — ABNORMAL LOW (ref 36.7–47.0)
HCT: 25 % — ABNORMAL LOW (ref 36.7–47.0)
HGB: 9.2 g/dL — ABNORMAL LOW (ref 12.5–16.3)
HGB: 8.5 g/dL — ABNORMAL LOW (ref 12.5–16.3)
HGB: 8.5 g/dL — ABNORMAL LOW (ref 12.5–16.3)

## 2016-01-01 LAB — CBC
HCT: 25.4 % — ABNORMAL LOW (ref 36.7–47.0)
HGB: 8.6 g/dL — ABNORMAL LOW (ref 12.5–16.3)
MCH: 29.7 pg (ref 27.4–33.0)
MCHC: 33.9 g/dL (ref 32.5–35.8)
MCHC: 33.9 g/dL (ref 32.5–35.8)
MCV: 87.4 fL (ref 78.0–100.0)
MPV: 9.2 fL (ref 7.5–11.5)
PLATELETS: 106 x10ˆ3/uL — ABNORMAL LOW (ref 140–450)
RBC: 2.91 x10ˆ6/uL — ABNORMAL LOW (ref 4.06–5.63)
RDW: 14.7 % (ref 12.0–15.0)
WBC: 8.5 x10ˆ3/uL (ref 3.5–11.0)

## 2016-01-01 LAB — PRODUCT: FFP/PLASMA - UNITS: UNIT DIVISION: 0

## 2016-01-01 LAB — ALBUMIN: ALBUMIN: 2.5 g/dL — ABNORMAL LOW (ref 3.4–4.8)

## 2016-01-01 LAB — PHOSPHORUS: PHOSPHORUS: 3.4 mg/dL (ref 2.3–4.0)

## 2016-01-01 LAB — MAGNESIUM
MAGNESIUM: 1.6 mg/dL (ref 1.6–2.5)
MAGNESIUM: 1.6 mg/dL (ref 1.6–2.5)

## 2016-01-01 MED ORDER — MAGNESIUM SULFATE 2 GRAM/50 ML (4 %) IN WATER INTRAVENOUS PIGGYBACK
2.0000 g | INJECTION | INTRAVENOUS | Status: AC
Start: 2016-01-01 — End: 2016-01-01
  Administered 2016-01-01: 2 g via INTRAVENOUS
  Filled 2016-01-01: qty 50

## 2016-01-01 MED ORDER — TAMSULOSIN 0.4 MG CAPSULE
0.40 mg | ORAL_CAPSULE | Freq: Every evening | ORAL | Status: DC
Start: 2016-01-01 — End: 2016-01-04
  Administered 2016-01-01 – 2016-01-03 (×3): 0.4 mg via ORAL
  Filled 2016-01-01 (×4): qty 1

## 2016-01-01 MED ADMIN — levOCARNitine (with sugar) 100 mg/mL oral solution: INTRAVENOUS | @ 21:00:00

## 2016-01-01 MED ADMIN — dextrose 10 % in water (D10W) intravenous solution: @ 15:00:00 | NDC 00338002303

## 2016-01-01 MED ADMIN — sodium chloride 0.9 % intravenous solution: INTRAVENOUS | @ 01:00:00

## 2016-01-01 NOTE — H&P (Addendum)
[facility]  General Medicine  Admission H&P    Date of Service:  01/01/2016  [name],[name], 70 y.o. male  Date of Admission:  12/31/2015  Date of Birth:  1945-10-09  PCP: No Established Pcp          Information Obtained from: patient and history reviewed via medical record  Chief Complaint:  Duodenal Ulcer Bleeding     HPI: [name] is a 70 y.o., White male with PMH of chronic back pain and mitral valve prolapse who presents as transfer from [facility] hospital to MICU with hx of syncope, melena and hematemesis. Patient reports feeling week yesterday morning and had EMS to assist him to go to bathroom where her had a large amount of black tarry stool. Patient was intubated for airway protection and transfused 8 units of RBC's at outside facility. EGD over there showed posterior duodenal ulcer with active pulsatile artery that was treated with endo clip and injection of epinephrine. Upon arrival to [facility] MICU Hgb was 5.9 so IR performed embolization of the gastroduodenal artery then Hgb stabilized and patient was extubated earlier this morning to RA without issues. Patient reports heavy chronic use of ibuprofen for his chronic back for the last 30 years. Patient states traveling to [place] for the last month. Patient denied fever, chills, SOB, chest or abdominal pain. Patient is a former smoker of 1 pack daily for 8 years and quit 50 years ago. Patient reports drinking beer once every while and 1-2 glass of wine on dinner. Patient reports brief paroxysmal episodes of palpitations that were attributed to his mitral valve prolapse per patient.         PAST MEDICAL:    Past Medical History:   Diagnosis Date    Mitral valve prolapse     No past surgical history on file.         Medications Prior to Admission     None        No Known Allergies    Family History  No family history on file.      Social History  Social History   Substance Use Topics    Smoking status: Former Smoker     Packs/day: 1.00     Years:  8.00     Types: Cigarettes    Smokeless tobacco: Not on file    Alcohol use Yes      Comment: 1-2 glasse wine on dinner daily, beer once every while         ROS:   Constitutional: negative for fevers and chills  Eyes: negative for visual disturbance and irritation  Ears, nose, mouth, throat, and face: negative for hearing loss and tinnitus  Respiratory: negative for cough or dyspnea on exertion  Cardiovascular: negative for chest pain and palpitations  Gastrointestinal: negative for nausea, vomiting and diarrhea  Genitourinary:negative for frequency and dysuria  Integument/breast: negative for rash and skin lesion(s)  Hematologic/lymphatic: negative for easy bruising and bleeding  Musculoskeletal:negative for myalgias and arthralgias  Neurological: negative for headaches, coordination problems, gait problems and weakness  Behavioral/Psych: negative for anxiety and depression  Endocrine: negative for diabetic symptoms including polyuria and polydipsia  All other ROS Negative     Examination:  Temperature: 37.8 C (100 F) Heart Rate: 96 BP (Non-Invasive): 119/63   Respiratory Rate: 17 SpO2-1: 98 % Pain Score (Numeric, Faces): 2   General: appears in good health  Eyes: Conjunctiva clear., Pupils equal and round. , Sclera non-icteric.   HENT:ENMT without  erythema or injection, mucous membranes moist.  Neck: no thyromegaly or lymphadenopathy  Lungs: Clear to auscultation bilaterally.   Cardiovascular: regular rate and rhythm, S1, S2 normal, no murmur, click, rub or gallop  Abdomen: Soft, non-tender, Bowel sounds normal  Genito-urinary: Deferred  Extremities: No cyanosis or edema  Skin: Skin warm and dry  Neurologic: CN II - XII grossly intact , Alert and oriented X 3, normal strength and tone. Normal symmetric reflexes. Normal coordination and gait  Glasgow: Total score only: 15  Coma (GCS Coma less than 8): Coma -Not applicable  Lymphatics: No lymphadenopathy  Psychiatric: Normal    Labs:    Lab Results for Last 24  Hours:    Results for orders placed or performed during the hospital encounter of 12/31/15 (from the past 24 hour(s))   H & H   Result Value Ref Range    HGB 9.2 (L) 12.5 - 16.3 g/dL    HCT 16.1 (L) 09.6 - 47.0 %   PHOSPHORUS   Result Value Ref Range    PHOSPHORUS 3.4 2.3 - 4.0 mg/dL   MAGNESIUM   Result Value Ref Range    MAGNESIUM 1.6 1.6 - 2.5 mg/dL   BASIC METABOLIC PANEL   Result Value Ref Range    SODIUM 138 136 - 145 mmol/L    POTASSIUM 3.9 3.5 - 5.1 mmol/L    CHLORIDE 109 96 - 111 mmol/L    CO2 TOTAL 26 22 - 32 mmol/L    ANION GAP 3 (L) 4 - 13 mmol/L    CALCIUM 7.8 (L) 8.5 - 10.4 mg/dL    GLUCOSE 92 65 - 045 mg/dL    BUN 18 8 - 25 mg/dL    CREATININE 4.09 8.11 - 1.27 mg/dL    BUN/CREA RATIO 24 (H) 6 - 22    ESTIMATED GFR >59 >59 mL/min/1.57m2   CBC WITH DIFF   Result Value Ref Range    WBC 9.2 3.5 - 11.0 x103/uL    RBC 3.04 (L) 4.06 - 5.63 x106/uL    HGB 8.9 (L) 12.5 - 16.3 g/dL    HCT 91.4 (L) 78.2 - 47.0 %    MCV 86.2 78.0 - 100.0 fL    MCH 29.4 27.4 - 33.0 pg    MCHC 34.1 32.5 - 35.8 g/dL    RDW 95.6 21.3 - 08.6 %    PLATELETS 89 (L) 140 - 450 x103/uL    MPV 8.6 7.5 - 11.5 fL    NEUTROPHIL % 71 %    LYMPHOCYTE % 14 %    MONOCYTE % 9 %    EOSINOPHIL % 7 %    BASOPHIL % 0 %    NEUTROPHIL # 6.52 1.50 - 7.70 x103/uL    LYMPHOCYTE # 1.25 1.00 - 4.80 x103/uL    MONOCYTE # 0.83 0.30 - 1.00 x103/uL    EOSINOPHIL # 0.62 (H) 0.00 - 0.50 x103/uL    BASOPHIL # 0.04 0.00 - 0.20 x103/uL   VENOUS BLOOD GAS   Result Value Ref Range    %FIO2 30.0 %    BICARBONATE 24.7 22.0 - 26.0 mmol/L    PCO2 56.00 (H) 41.00 - 51.00 mm/Hg    PH 7.32 7.31 - 7.41    PO2 26.0 (L) 35.0 - 50.0 mm/Hg    BASE EXCESS 1.6 -3.0 - 3.0 mmol/L   H & H   Result Value Ref Range    HGB 8.5 (L) 12.5 - 16.3 g/dL    HCT 57.8 (  L) 36.7 - 47.0 %   BASIC METABOLIC PANEL   Result Value Ref Range    SODIUM 139 136 - 145 mmol/L    POTASSIUM 3.6 3.5 - 5.1 mmol/L    CHLORIDE 108 96 - 111 mmol/L    CO2 TOTAL 24 22 - 32 mmol/L    ANION GAP 7 4 - 13 mmol/L       CALCIUM 7.8 (L) 8.5 - 10.4 mg/dL    GLUCOSE 161106 65 - 096139 mg/dL    BUN 16 8 - 25 mg/dL    CREATININE 0.450.74 4.090.62 - 1.27 mg/dL    BUN/CREA RATIO 22 6 - 22    ESTIMATED GFR >59 >59 mL/min/1.2773m2   MAGNESIUM   Result Value Ref Range    MAGNESIUM 1.6 1.6 - 2.5 mg/dL   CBC   Result Value Ref Range    WBC 8.5 3.5 - 11.0 x103/uL    RBC 2.91 (L) 4.06 - 5.63 x106/uL    HGB 8.6 (L) 12.5 - 16.3 g/dL    HCT 81.125.4 (L) 91.436.7 - 47.0 %    MCV 87.4 78.0 - 100.0 fL    MCH 29.7 27.4 - 33.0 pg    MCHC 33.9 32.5 - 35.8 g/dL    RDW 78.214.7 95.612.0 - 21.315.0 %    PLATELETS 106 (L) 140 - 450 x103/uL    MPV 9.2 7.5 - 11.5 fL       Imaging Studies:    KUB 01/01/16:  Colonic ileus pattern. Recommend continued followup to exclude obstruction    DNR Status:  Full Code    Assessment/Plan:   Active Hospital Problems    Diagnosis    BPH (benign prostatic hyperplasia)    GI bleeding    Secondary thrombocytopenia     Henry LiBruce Barber is a 70 y.o., White male with PMH of chronic back pain and mitral valve prolapse who [facility] bleeding     Upper GI Bleeding due to Duodenal Ulcer:   - hx of NSAID use, recent travel to [place]    - The patient is s/p arterial embolization by IR on 5/20.   - Extubated on 5/20 to RA    - received 3 units of RBC, 1 platelets and FFP during this admission.   - On PPI drip.   - H&H q12 hrs stable    - Endoscopy report from outside facility c/w active 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 Endo clip and injection of epi tried prior to transfer.   - if patient bleeds again; call IR for re-embolization     Thrombocytopenia:   - Plt 106; baseline unavailable  - likely 2/2 chronic alcohol use   - will monitor      Sinus tachycardia:  - likely 2/2 extubation vs hx of mitral valve prolapse   - EKG : NSR.   - had runs of SVT with no symptoms.   - the patient on tele, will continue to monitor.     Chronic issues:   BPH: Flomax 0.4        DVT/PE  Prophylaxis: SCDs/ Almon RegisterVenodynes    Yasser Kabbani, MD

## 2016-01-01 NOTE — Progress Notes (Addendum)
Medical Intensive Care Unit, daily progress note  Patient: [name], 70 y.o. male   MRN:  [id number]  Date: 01/01/2016   LOS: 1 Days    CC/Reason for ICU admission: UGIB    Subjective/Overnight events:   The patient is s/p extubation. He is doing fine now with no complaint.   Had runs of SVT after extubation with no symptoms.   Hb has been stable since he had the arterial embolization by IR last night.   Will start diet and he is probably for transfer to floor today.       Vitals:  BP 114/80   Pulse (!) 103   Temp 37.1 C (98.8 F)   Resp 14   Ht 1.93 m ( )   Wt 80.5 kg (177 lb 7.5 oz)   SpO2 100%   BMI 21.6 kg/m2    I/O:     Last 24:   Intake/Output Summary (Last 24 hours) at 01/01/16 0704  Last data filed at 01/01/16 0700   Gross per 24 hour   Intake          4124.93 ml   Output             2855 ml   Net          1269.93 ml     Ventilator settings:   The patient is on NC.     Lines/Drains/Foley:   PIV    Inpatient medications:    Reviewed.    Review of Systems:   No chest pain, cough, SOB.  No abdominal pain, diarrhea or vomiting.     Physical Exam:  General: the patient is s/p extubation, alert and oriented   Eyes: Conjunctiva clear, Pupils equal and round, reactive to light .   HENT: Head atraumatic and normocephalic, Mouth mucous membranes moist.  Neck: No JVD, supple, symmetrical, trachea midline and no adenopathy or thyromegaly  Lungs: Clear to auscultation bilaterally.   Cardiovascular: regular rate and rhythm, S1, S2 normal, no murmur, click, rub or gallop  Abdomen: Soft, non-tender, Bowel sounds normal, non-distended  Genito-urinary: No rash or discharge.   Extremities: extremities normal, atraumatic, no cyanosis or edema  Skin: Skin warm and dry and No rashes  Neurologic: Grossly normal, CN II - XII grossly intact , Alert and oriented x3  Lymphatics: No lymphadenopathy  Psychiatric: Affect Normal     Labs:  I have reviewed all recent lab values and culture results.     Imaging:  I have reviewed  all recent imaging studies.     Patient Active Problem List   Diagnosis    GI bleeding    Secondary thrombocytopenia    BPH (benign prostatic hyperplasia)     Assessment and Plan:  [name] 70 y.o. male with hx of back pain with chronic NSAID use, the patient presented from [facility] s/p endoscopy with posterior duodenal ulcer.     Upper GI Bleeding Secondary To Duodenal Ulcer:   With acute anemia.   - hx of NSAID use.   - The patient is s/p arterial embolization by IR.   - Extubated this morning and he is doing well.   - On PPI drip.   - Hb has been stable since last night around 8.   - 3 units of RBC, 1 platelets and FFP ordered during this admission.   - Endoscopy report from outside facility c/w active 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 Endo clip and injection of  epi tried prior to transfer.     Sinus tachycardia:  - expected after extubation.   - ECG : NSR.   - had runs of SVT with no symptoms.   - the patient on tele, will continue to monitor.       Chronic issues:   BPH: Flomax 0.4   Hx of depression.         Family communication/Prognosis:  Family member on the bedside updated about patient's current condition and prognosis.    Prognosis : good    Prophylaxis:   DVT:   not due to the bleeding   GI:  Proton Pump inhibitor  Analgesia: None   Antibiotics: None  Nutrition: Clear liquid.   Therapy: PT  Sedation: None      Mohammed Alshehri  PGY1-Internal Medicine  [phone]    I saw and examined the patient, personally performed the critical or key portions of the service and discussed patient management with the ICU team including the resident, fellow, nurse and ancillary staff. Patient was examined during morning rounds. I reviewed the resident's note and agree with the history, findings and plan of care. Exceptions are edited/noted above. Laboratory, hemodynamic, respiratory support, and radiological data were also reviewed and discussed.     I saw and evaluated  the patient. I reviewed the resident's note. I agree with the findings and plan of care as documented in the resident's note. Any exceptions/additions are noted above.   Steele BergJohn E. Londa Mackowski, M.D.  Critical care time 35 minutes

## 2016-01-01 NOTE — Care Plan (Signed)
Problem: General Plan of Care(Adult,OB)  Goal: Plan of Care Review(Adult,OB)  The patient and/or their representative will communicate an understanding of their plan of care   Outcome: Ongoing (see interventions/notes)  Pt continues to be intubated and on mechanical ventilation.  Ventilator settings are SIMV(PRVC)PS   FIO2 (%) 40     Peep(cm/H2O) 5     Rate(bpm) 10     Tidal Volume(mls) 550     Pressure Support(cm/H2O) 12        Morning VBG  7.32      56.00       26.0       24.7      1.6          Respiratory rate had been weaned to 8 but after morning VBG it was increased to 10.  Pt was tried on Pressure Support but respiratory rate dropped to low.   Will continue to monitor respiratory status and continue to wean pt as tolerated towards extubation.    Problem: Ventilation, Mechanical Invasive (Adult)  Prevent and manage potential problems including:1. artificial airway induced skin/tissue breakdown2. gastritis/stress ulcer3. immobility4. inability to wean5. malnutrition6. mechanical dysfunction7. situational response8. ventilator- induced lung injury   Goal: Signs and Symptoms of Listed Potential Problems Will be Absent or Manageable (Ventilation, Mechanical Invasive)  Signs and symptoms of listed potential problems will be absent or manageable by discharge/transition of care (reference Ventilation, Mechanical Invasive (Adult) CPG).   Outcome: Ongoing (see interventions/notes)

## 2016-01-01 NOTE — Progress Notes (Signed)
INTERVENTIONAL RADIOLOGY NOTE    SUBJECTIVE: The patient is extubated and sitting in bed. He denies any bleeding.    OBJECTIVE: No right groin hematoma    Imaging: None reviewed    ASSESSMENT AND PLAN:     The patient is a 70 year old male with duodenal ulcer who underwent embolization of the gastroduodenal artery. The patient has been stable overnight and has been extubated. Currently he does not have any signs of active bleeding.    If the patient rebleeds, can repeat angiogram and perform additional embolization.    Melvyn NovasKevin M Bani Gianfrancesco, MD 01/01/2016, 12:25    Associate Professor  Interventional Radiology

## 2016-01-01 NOTE — Nurses Notes (Signed)
Pt having frequent bouts of SVT up to the 170s. MICU service aware. No orders at this time. Will continue to monitor.

## 2016-01-01 NOTE — Care Plan (Signed)
Problem: General Plan of Care(Adult,OB)  Goal: Plan of Care Review(Adult,OB)  The patient and/or their representative will communicate an understanding of their plan of care   Outcome: Ongoing (see interventions/notes)  Patient arrived yesterday from outside facility.  Patient previously intubated at outside facility, remained intubated over night, fentanyl gtt and versed gtt titrated for possible wean and extubation today.  H and H drawn as ordered, remained stable overnight.  Restraints remain in place to prevent self extubation.  Will continue to monitor.  Goal: Individualization/Patient Specific Goal(Adult/OB)  Outcome: Ongoing (see interventions/notes)    Problem: Fall Risk (Adult)  Goal: Identify Related Risk Factors and Signs and Symptoms  Related risk factors and signs and symptoms are identified upon initiation of Human Response Clinical Practice Guideline (CPG)   Outcome: Ongoing (see interventions/notes)  Goal: Absence of Falls  Patient will demonstrate the desired outcomes by discharge/transition of care.   Outcome: Ongoing (see interventions/notes)    Problem: Non-violent/Non-Self Destructive Restraints  Goal: Alternative methods tried prior to restraints  Outcome: Ongoing (see interventions/notes)  Goal: Patient free from injury and discomfort  Outcome: Ongoing (see interventions/notes)  Goal: Autonomy maintained at the highest possible level  Outcome: Ongoing (see interventions/notes)  Goal: Need for restraints reassessed per policy  Outcome: Ongoing (see interventions/notes)  Goal: Patient education provided  Outcome: Ongoing (see interventions/notes)  Goal: Problem Interventions  Outcome: Ongoing (see interventions/notes)    Problem: Pressure Ulcer Risk (Braden Scale) (Adult,Obstetrics,Pediatric)  Goal: Identify Related Risk Factors and Signs and Symptoms  Related risk factors and signs and symptoms are identified upon initiation of Human Response Clinical Practice Guideline (CPG)   Outcome: Ongoing  (see interventions/notes)  Goal: Skin Integrity  Patient will demonstrate the desired outcomes by discharge/transition of care.   Outcome: Ongoing (see interventions/notes)    Problem: Ventilation, Mechanical Invasive (Adult)  Prevent and manage potential problems including:1. artificial airway induced skin/tissue breakdown2. gastritis/stress ulcer3. immobility4. inability to wean5. malnutrition6. mechanical dysfunction7. situational response8. ventilator- induced lung injury   Goal: Signs and Symptoms of Listed Potential Problems Will be Absent or Manageable (Ventilation, Mechanical Invasive)  Signs and symptoms of listed potential problems will be absent or manageable by discharge/transition of care (reference Ventilation, Mechanical Invasive (Adult) CPG).   Outcome: Ongoing (see interventions/notes)    Problem: Gastrointestinal Bleeding (Adult)  Prevent and manage potential problems including:1. fluid imbalance2. hemorrhage3. hypoxia/hypoxemia4. peritonitis5. situational response   Goal: Signs and Symptoms of Listed Potential Problems Will be Absent or Manageable (Gastrointestinal Bleeding)  Signs and symptoms of listed potential problems will be absent or manageable by discharge/transition of care (reference Gastrointestinal Bleeding (Adult) CPG).   Outcome: Ongoing (see interventions/notes)

## 2016-01-01 NOTE — Nurses Notes (Signed)
Pt was extubated @ 0830 to NC. Pt tolerated fine. Will continue to monitor.

## 2016-01-01 NOTE — Respiratory Therapy (Signed)
Patient was extubated to 2L nasal cannula, VSS, no strider at this time.  Patient was able to achieve 3L on IS. Will continue to follow.

## 2016-01-01 NOTE — Nurses Notes (Signed)
01/01/16 1000   Restraint Type(Q2 Hours)   Restraint Used M   Soft Restraint Right Wrist DISCONTINUED   Soft Restraint Left Wrist DISCONTINUED     Restraints were discontinued after patient was extubated. No longer needed to facilitate treatment. Will continue to monitor.

## 2016-01-01 NOTE — Nurses Notes (Signed)
Pt continues to have SVT up to the 170s, sustaining for no longer than 5 seconds. Medicine 5 service is now aware. Will continue to monitor.

## 2016-01-02 ENCOUNTER — Inpatient Hospital Stay (HOSPITAL_COMMUNITY): Payer: Medicare Other

## 2016-01-02 DIAGNOSIS — E876 Hypokalemia: Secondary | ICD-10-CM | POA: Insufficient documentation

## 2016-01-02 DIAGNOSIS — K567 Ileus, unspecified: Secondary | ICD-10-CM

## 2016-01-02 LAB — ECG 12-LEAD
Atrial Rate: 88 {beats}/min
Atrial Rate: 111 {beats}/min
Calculated P Axis: 81 degrees
Calculated P Axis: 58 degrees
Calculated R Axis: 69 degrees
Calculated R Axis: 22 degrees
Calculated R Axis: 22 degrees
Calculated T Axis: 71 degrees
Calculated T Axis: 42 degrees
PR Interval: 164 ms
PR Interval: 162 ms
QRS Duration: 80 ms
QRS Duration: 84 ms
QT Interval: 388 ms
QT Interval: 322 ms
QT Interval: 322 ms
QTC Calculation: 469 ms
QTC Calculation: 437 ms
Ventricular rate: 88 {beats}/min
Ventricular rate: 111 {beats}/min

## 2016-01-02 LAB — CBC WITH DIFF
BASOPHIL #: 0.02 x10ˆ3/uL (ref 0.00–0.20)
BASOPHIL %: 0 %
EOSINOPHIL #: 0.23 x10ˆ3/uL (ref 0.00–0.50)
EOSINOPHIL %: 3 %
HCT: 23.6 % — ABNORMAL LOW (ref 36.7–47.0)
HGB: 8.1 g/dL — ABNORMAL LOW (ref 12.5–16.3)
LYMPHOCYTE #: 0.88 x10ˆ3/uL — ABNORMAL LOW (ref 1.00–4.80)
LYMPHOCYTE %: 13 %
MCH: 29.6 pg (ref 27.4–33.0)
MCH: 29.6 pg (ref 27.4–33.0)
MCHC: 34.2 g/dL (ref 32.5–35.8)
MCV: 86.7 fL (ref 78.0–100.0)
MONOCYTE #: 0.52 x10ˆ3/uL (ref 0.30–1.00)
MONOCYTE %: 8 %
MPV: 8.7 fL (ref 7.5–11.5)
NEUTROPHIL #: 5.06 x10ˆ3/uL (ref 1.50–7.70)
NEUTROPHIL %: 76 %
PLATELETS: 104 x10ˆ3/uL — ABNORMAL LOW (ref 140–450)
RBC: 2.72 x10ˆ6/uL — ABNORMAL LOW (ref 4.06–5.63)
RDW: 14.6 % (ref 12.0–15.0)
WBC: 6.7 x10ˆ3/uL (ref 3.5–11.0)

## 2016-01-02 LAB — BASIC METABOLIC PANEL
ANION GAP: 5 mmol/L (ref 4–13)
BUN/CREA RATIO: 21 (ref 6–22)
BUN: 14 mg/dL (ref 8–25)
CALCIUM: 7.8 mg/dL — ABNORMAL LOW (ref 8.5–10.4)
CHLORIDE: 106 mmol/L (ref 96–111)
CO2 TOTAL: 25 mmol/L (ref 22–32)
CREATININE: 0.67 mg/dL (ref 0.62–1.27)
ESTIMATED GFR: >59 mL/min/1.73mˆ2 (ref >59)
GLUCOSE: 105 mg/dL (ref 65–139)
POTASSIUM: 3.2 mmol/L — ABNORMAL LOW (ref 3.5–5.1)
SODIUM: 136 mmol/L (ref 136–145)

## 2016-01-02 LAB — H & H
HCT: 23.8 % — ABNORMAL LOW (ref 36.7–47.0)
HGB: 8.2 g/dL — ABNORMAL LOW (ref 12.5–16.3)

## 2016-01-02 LAB — PHOSPHORUS: PHOSPHORUS: 2.5 mg/dL (ref 2.3–4.0)

## 2016-01-02 LAB — MAGNESIUM: MAGNESIUM: 1.6 mg/dL (ref 1.6–2.5)

## 2016-01-02 MED ORDER — POTASSIUM CHLORIDE 10 MEQ/100ML IN STERILE WATER INTRAVENOUS PIGGYBACK
10.0000 meq | INJECTION | INTRAVENOUS | Status: AC
Start: 2016-01-02 — End: 2016-01-02
  Administered 2016-01-02 (×3): 10 meq via INTRAVENOUS
  Filled 2016-01-02: qty 300

## 2016-01-02 MED ADMIN — morphine 2 mg/mL injection syringe

## 2016-01-02 NOTE — Nurses Notes (Signed)
Patient arrives to unit by wheelchair from MICU. Oriented to room and call system.

## 2016-01-02 NOTE — Progress Notes (Signed)
[facility]  Medicine Progress Note  Full Code    [name]  Date of service: 01/02/2016    Subjective:  [name] is doing well this morning. Hb stable s/p embolization. No abd pain. He did have asymptomatic SVT last night which appears to be chronic. He had a BM that was black but not grossly bloody    Vital Signs:  Temp (24hrs) Max:37.8 C (100 F)      Systolic (24hrs), Avg:115 , Min:103 , Max:126     Diastolic (24hrs), Avg:68, Min:53, Max:89    Temp  Avg: 37.3 C (99.2 F)  Min: 37.1 C (98.8 F)  Max: 37.8 C (100 F)  Pulse  Avg: 99.7  Min: 94  Max: 110  Resp  Avg: 17.8  Min: 0  Max: 23  SpO2  Avg: 96.1 %  Min: 85 %  Max: 99 %  MAP (Non-Invasive)  Avg: 81 mmHG  Min: 72 mmHG  Max: 95 mmHG  Pain Score (Numeric, Faces): 0  Fi02    I/O:  I/O last 24 hours:    Intake/Output Summary (Last 24 hours) at 01/02/16 1535  Last data filed at 01/02/16 1500   Gross per 24 hour   Intake             2124 ml   Output              900 ml   Net             1224 ml     I/O current shift:  05/21 0800 - 05/21 1559  In: 496 [I.V.:496]  Out: -   Blood Sugars: Point of Care Glucose Last 24 Hr Results:  No results for input(s): GLUCOSEPOC in the last 24 hours.      Current Facility-Administered Medications:  NS flush syringe 2 mL Intracatheter Q8HRS   And      NS flush syringe 2-6 mL Intracatheter Q1 MIN PRN   pantoprazole (PROTONIX) 80 mg in NS 100 mL infusion  Intravenous Q12H   And      [START ON 01/03/2016] pantoprazole (PROTONIX) delayed release tablet 40 mg Oral Q24H   tamsulosin (FLOMAX) capsule 0.4 mg Oral Daily after Dinner       No Known Allergies    Physical Exam:  Filed Vitals:    01/02/16 0600 01/02/16 0800 01/02/16 1000 01/02/16 1400   BP: 110/65 (!) 118/53 103/89 118/67   Pulse: (!) 105 (!) 110 95 (!) 103   Resp: (!) 21 14 20 18    Temp:  37.2 C (99 F)     SpO2: 97% (!) 85% 99% 98%     General: appears in good health and no distress  HENT: mmm, nonicteric  Lungs: Clear to auscultation bilaterally.      Cardiovascular: regular rate and rhythm, S1, S2 normal, no murmur  Abdomen: Soft, non-tender, Bowel sounds normal, non-distended  Extremities: no edema or tenderness in the calves   Psychiatric: Normal affect, behavior, memory, thought content, judgement, and speech.      Labs:  Lab Results for Last 24 Hours:    Results for orders placed or performed during the hospital encounter of 12/31/15 (from the past 24 hour(s))   CBC   Result Value Ref Range    WBC 8.5 3.5 - 11.0 x103/uL    RBC 2.91 (L) 4.06 - 5.63 x106/uL    HGB 8.6 (L) 12.5 - 16.3 g/dL    HCT 47.4 (L) 25.9 - 47.0 %  MCV 87.4 78.0 - 100.0 fL    MCH 29.7 27.4 - 33.0 pg    MCHC 33.9 32.5 - 35.8 g/dL    RDW 16.1 09.6 - 04.5 %    PLATELETS 106 (L) 140 - 450 x103/uL    MPV 9.2 7.5 - 11.5 fL   PHOSPHORUS   Result Value Ref Range    PHOSPHORUS 2.5 2.3 - 4.0 mg/dL   MAGNESIUM   Result Value Ref Range    MAGNESIUM 1.6 1.6 - 2.5 mg/dL   BASIC METABOLIC PANEL   Result Value Ref Range    SODIUM 136 136 - 145 mmol/L    POTASSIUM 3.2 (L) 3.5 - 5.1 mmol/L    CHLORIDE 106 96 - 111 mmol/L    CO2 TOTAL 25 22 - 32 mmol/L    ANION GAP 5 4 - 13 mmol/L    CALCIUM 7.8 (L) 8.5 - 10.4 mg/dL    GLUCOSE 409 65 - 811 mg/dL    BUN 14 8 - 25 mg/dL    CREATININE 9.14 7.82 - 1.27 mg/dL    BUN/CREA RATIO 21 6 - 22    ESTIMATED GFR >59 >59 mL/min/1.67m2   CBC WITH DIFF   Result Value Ref Range    WBC 6.7 3.5 - 11.0 x103/uL    RBC 2.72 (L) 4.06 - 5.63 x106/uL    HGB 8.1 (L) 12.5 - 16.3 g/dL    HCT 95.6 (L) 21.3 - 47.0 %    MCV 86.7 78.0 - 100.0 fL    MCH 29.6 27.4 - 33.0 pg    MCHC 34.2 32.5 - 35.8 g/dL    RDW 08.6 57.8 - 46.9 %    PLATELETS 104 (L) 140 - 450 x103/uL    MPV 8.7 7.5 - 11.5 fL    NEUTROPHIL % 76 %    LYMPHOCYTE % 13 %    MONOCYTE % 8 %    EOSINOPHIL % 3 %    BASOPHIL % 0 %    NEUTROPHIL # 5.06 1.50 - 7.70 x103/uL    LYMPHOCYTE # 0.88 (L) 1.00 - 4.80 x103/uL    MONOCYTE # 0.52 0.30 - 1.00 x103/uL    EOSINOPHIL # 0.23 0.00 - 0.50 x103/uL    BASOPHIL # 0.02 0.00 -  0.20 x103/uL   H & H   Result Value Ref Range    HGB 8.2 (L) 12.5 - 16.3 g/dL    HCT 62.9 (L) 52.8 - 47.0 %       Radiology:    KUB 5/20  IMPRESSION: Colonic ileus pattern. Recommend continued followup to exclude obstruction    Microbiology:    5/20 b culture NGTD    PT/OT: No    Consults: IR    Hardware (lines, foley's, tubes):     Assessment/ Plan:   Active Hospital Problems    Diagnosis    Hypokalemia    BPH (benign prostatic hyperplasia)    GI bleeding    Secondary thrombocytopenia       Assessment and Plan:  [name] 70 y.o. male with hx of back pain with chronic NSAID use, the patient presented from [facility] s/p endoscopy with posterior duodenal ulcer. He underwent IR embolization here now with stable Hb.    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   - The patient is s/p arterial embolization by IR.   - On PPI drip.   - Hb has been stable   -  d/c IVF  - can move to floor  - can check H pylori status    Sinus tachycardia:  - expected after extubation.   - ECG : NSR.   - had runs of SVT with no symptoms.   - the patient on tele, will continue to monitor.     Ileus  - seen on KUB  - f/u today  - abd soft with good BS and tolerating po  - had BM  - follow clinically    Chronic issues:   BPH: Flomax 0.4   Hx of depression      Henry GarbeHugh Carlton Palmer Jr., MD

## 2016-01-02 NOTE — Care Plan (Signed)
Problem: Pressure Ulcer Risk (Braden Scale) (Adult,Obstetrics,Pediatric)  Goal: Identify Related Risk Factors and Signs and Symptoms  Related risk factors and signs and symptoms are identified upon initiation of Human Response Clinical Practice Guideline (CPG)   Outcome: Ongoing (see interventions/notes)  Goal: Skin Integrity  Patient will demonstrate the desired outcomes by discharge/transition of care.   Outcome: Ongoing (see interventions/notes)    Problem: Gastrointestinal Bleeding (Adult)  Prevent and manage potential problems including:1. fluid imbalance2. hemorrhage3. hypoxia/hypoxemia4. peritonitis5. situational response   Goal: Signs and Symptoms of Listed Potential Problems Will be Absent or Manageable (Gastrointestinal Bleeding)  Signs and symptoms of listed potential problems will be absent or manageable by discharge/transition of care (reference Gastrointestinal Bleeding (Adult) CPG).   Outcome: Ongoing (see interventions/notes)

## 2016-01-03 LAB — BASIC METABOLIC PANEL
ANION GAP: 6 mmol/L (ref 4–13)
BUN/CREA RATIO: 18 (ref 6–22)
BUN: 13 mg/dL (ref 8–25)
CALCIUM: 8.2 mg/dL — ABNORMAL LOW (ref 8.5–10.4)
CHLORIDE: 106 mmol/L (ref 96–111)
CO2 TOTAL: 27 mmol/L (ref 22–32)
CREATININE: 0.72 mg/dL (ref 0.62–1.27)
CREATININE: 0.72 mg/dL (ref 0.62–1.27)
ESTIMATED GFR: >59 mL/min/1.73mˆ2 (ref >59)
GLUCOSE: 96 mg/dL (ref 65–139)
POTASSIUM: 3.6 mmol/L (ref 3.5–5.1)
SODIUM: 139 mmol/L (ref 136–145)

## 2016-01-03 LAB — CBC WITH DIFF
BASOPHIL #: 0.03 x10ˆ3/uL (ref 0.00–0.20)
BASOPHIL %: 1 %
EOSINOPHIL #: 0.52 x10ˆ3/uL — ABNORMAL HIGH (ref 0.00–0.50)
EOSINOPHIL %: 9 %
HCT: 24.3 % (ref 36.7–47.0)
HCT: 24.3 % — ABNORMAL LOW (ref 36.7–47.0)
HGB: 8.3 g/dL — ABNORMAL LOW (ref 12.5–16.3)
LYMPHOCYTE #: 0.92 x10ˆ3/uL — ABNORMAL LOW (ref 1.00–4.80)
LYMPHOCYTE %: 16 %
LYMPHOCYTE %: 16 %
MCH: 29.9 pg (ref 27.4–33.0)
MCHC: 34.3 g/dL (ref 32.5–35.8)
MCV: 87.1 fL (ref 78.0–100.0)
MONOCYTE #: 0.55 x10ˆ3/uL (ref 0.30–1.00)
MONOCYTE %: 9 %
MPV: 8.8 fL (ref 7.5–11.5)
NEUTROPHIL #: 3.87 x10ˆ3/uL (ref 1.50–7.70)
NEUTROPHIL %: 66 %
PLATELETS: 142 x10ˆ3/uL (ref 140–450)
RBC: 2.79 x10ˆ6/uL — ABNORMAL LOW (ref 4.06–5.63)
RDW: 14.4 % (ref 12.0–15.0)
WBC: 5.9 x10ˆ3/uL (ref 3.5–11.0)

## 2016-01-03 LAB — H & H
HCT: 27.5 % — ABNORMAL LOW (ref 36.7–47.0)
HGB: 9.2 g/dL — ABNORMAL LOW (ref 12.5–16.3)

## 2016-01-03 LAB — PHOSPHORUS
PHOSPHORUS: 2.5 mg/dL (ref 2.3–4.0)
PHOSPHORUS: 2.5 mg/dL (ref 2.3–4.0)

## 2016-01-03 LAB — MAGNESIUM: MAGNESIUM: 1.5 mg/dL — ABNORMAL LOW (ref 1.6–2.5)

## 2016-01-03 MED ORDER — SIMVASTATIN 20 MG TABLET
20.00 mg | ORAL_TABLET | Freq: Every evening | ORAL | Status: DC
Start: 2016-01-03 — End: 2016-01-04
  Administered 2016-01-03: 20 mg via ORAL
  Filled 2016-01-03 (×3): qty 1

## 2016-01-03 MED ORDER — MAGNESIUM SULFATE 2 GRAM/50 ML (4 %) IN WATER INTRAVENOUS PIGGYBACK
2.0000 g | INJECTION | Freq: Once | INTRAVENOUS | Status: AC
Start: 2016-01-03 — End: 2016-01-03
  Administered 2016-01-03: 2 g via INTRAVENOUS
  Filled 2016-01-03: qty 50

## 2016-01-03 MED ORDER — DONEPEZIL 5 MG TABLET
10.00 mg | ORAL_TABLET | Freq: Every evening | ORAL | Status: DC
Start: 2016-01-03 — End: 2016-01-04
  Administered 2016-01-03: 10 mg via ORAL
  Filled 2016-01-03 (×2): qty 2

## 2016-01-03 MED ORDER — POLYETHYLENE GLYCOL 3350 17 GRAM ORAL POWDER PACKET
17.0000 g | Freq: Every day | ORAL | Status: DC
Start: 2016-01-03 — End: 2016-01-04
  Administered 2016-01-03 – 2016-01-04 (×2): 17 g via ORAL
  Filled 2016-01-03 (×2): qty 1

## 2016-01-03 MED ORDER — SENNOSIDES 8.6 MG-DOCUSATE SODIUM 50 MG TABLET
1.0000 | ORAL_TABLET | Freq: Two times a day (BID) | ORAL | Status: DC
Start: 2016-01-03 — End: 2016-01-04
  Administered 2016-01-03 – 2016-01-04 (×3): 1 via ORAL
  Filled 2016-01-03 (×2): qty 1

## 2016-01-03 MED ORDER — TAMSULOSIN 0.4 MG CAPSULE
0.40 mg | ORAL_CAPSULE | Freq: Every evening | ORAL | Status: DC
Start: 2016-01-03 — End: 2016-01-03
  Administered 2016-01-03: 0 mg via ORAL
  Filled 2016-01-03 (×2): qty 1

## 2016-01-03 MED ORDER — VENLAFAXINE 75 MG TABLET
37.50 mg | ORAL_TABLET | Freq: Three times a day (TID) | ORAL | Status: DC
Start: 2016-01-03 — End: 2016-01-04
  Administered 2016-01-03 – 2016-01-04 (×2): 37.5 mg via ORAL
  Filled 2016-01-03 (×5): qty 0.5

## 2016-01-03 MED ADMIN — sodium chloride 0.9 % intravenous solution: ORAL | @ 23:00:00 | NDC 00338004904

## 2016-01-03 MED ADMIN — sodium chloride 0.9 % (flush) injection syringe: ORAL | @ 12:00:00

## 2016-01-03 MED ADMIN — nystatin 100,000 unit/gram topical powder: ORAL | @ 12:00:00 | NDC 00574200815

## 2016-01-03 NOTE — Care Plan (Signed)
Problem: General Plan of Care(Adult,OB)  Goal: Plan of Care Review(Adult,OB)  The patient and/or their representative will communicate an understanding of their plan of care   Outcome: Ongoing (see interventions/notes)  Discharge Plan:  Home (Patient/Family Member/other) (code 1)  MSW following for discharge. 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 Monitoring hgb. Pain control. Will follow for discharge needs      The patient will continue to be evaluated for developing discharge needs.

## 2016-01-03 NOTE — Care Plan (Signed)
[facility]  Physical Therapy Initial Evaluation    Patient Name: [name]  Date of Birth: 13-Jul-1946  Height: Height: 193 cm (_0 )  Weight: Weight: 82.6 kg (182 lb 1.6 oz)  Room/Bed: 07/A  Payor: MEDICARE / Plan: MEDICARE PART A / Product Type: Medicare /     Assessment:      Pt cooperative during PT eval.  He presents with good strength and overall mobility. Slight unsteadiness without LOB due to minimal amb for past 4 days. Encouraged amb with wife a few times daily. Recommend home d/c with no needs.     Discharge Needs:    Equipment Recommendation: none anticipated    Discharge Disposition: home with assist    Plan:   Current Intervention:  d/c from PT  To provide physical therapy services    for duration of evaluation only.    The risks/benefits of therapy have been discussed with the patient/caregiver and he/she is in agreement with the established plan of care.       Subjective & Objective        01/03/16 9381   Therapist Pager   PT Assigned/ Pager # [phone]   Rehab Session   Document Type evaluation   Total PT Minutes: 20   Patient Effort, Rehab Treatment good   Symptoms Noted During/After Treatment none   General Information   Patient Profile Review yes   Onset of Illness/Injury or Date of Surgery 12/31/15   General Observations: alert, cooperative, pleasant   Pertinent History Of Current Problem admitted for fatigue, syncope, and hematemesis   Precautions/Limitations no known precautions/limitations   Mutuality/Individual Preferences   Patient Specific Interventions Amb with S   What Information Would Help [place] Give You More Personalized Care? per wife, pt can be confused in the night when getting up to the bathroom   Living Environment   Lives With spouse   Living Arrangements house   Functional Level Prior   Ambulation 0-->independent   Transferring 0-->independent   Toileting 0-->independent   Self-Care   Equipment Currently Used at Home no   Pre Treatment  Status   Pre Treatment Patient Status Patient supine in bed;Call light within reach   Support Present Pre Treatment  Family present   Cognitive Assessment/Intervention   Behavior/Mood Observations alert;cooperative   Vital Signs   O2 Delivery Pre Treatment room air   O2 Delivery Post Treatment room air   Pain Assessment   Pain Comment (Pre/Post Treatment Pain) denies   RLE Assessment   RLE Assessment WFL- Within Functional Limits   LLE Assessment   LLE Assessment WFL- Within Functional Limits   Bed Mobility Assessment/Treatment   Supine-to-Sit Independence independent   Sit to Supine, Independence independent   Transfer Assessment/Treatment   Sit-Stand Independence stand-by assistance   Stand-Sit Independence stand-by assistance   Gait Assessment/Treatment   Independence  stand-by assistance   Distance  450   Comment  slight unsteadiness without LOB due to being fairly immobile for past 4 days. reports improved gait with shoes   Balance Skills Training   Sitting Balance: Static good balance   Sitting Balance: Dynamic good balance   Sit-to-Stand Balance good balance   Standing Balance: Static good balance   Standing Balance: Dynamic fair + balance   Post Treatment Status   Post Treatment Patient Status Patient supine in bed;Call light within reach   Support Present Post Treatment  Family present   Plan of Care Review   Plan  Of Care Reviewed With patient;spouse   Clinical Impression   Assessment Pt cooperative during PT eval.  He presents with good strength and overall mobility. Slight unsteadiness without LOB due to minimal amb for past 4 days. Encouraged amb with wife a few times daily. Recommend home d/c with no needs.    Patient/Family Goals Statement Return home   Criteria for Skilled Therapeutic Interventions Met no;no problems identified which require skilled intervention   Predicted Duration of Therapy Intervention (days/wks) evaluation only   Anticipated Equipment Needs at Discharge none anticipated      Anticipated Discharge Disposition home with assist   Evaluation Complexity Justification   Patient History: Co-morbity/factors that Impact Plan of Care 3 or more that implact Plan of Care   Examination Components 4 or more Exam elements addressed   Presentation Stable: Uncomplicated, straight-forward, problem focused   Clinical Decision Making Moderate complexity   Evaluation Complexity Moderate complexity       Therapist:   Rae Halsted, PT 01/03/2016 12:56  Pager #: [phone]

## 2016-01-03 NOTE — Progress Notes (Addendum)
[facility]  Medicine Progress Note  Full Code    [name]  Date of service: 01/03/2016    Subjective:  No acute events overnight. Hb stable s/p embolization. Continue to have asymptomatic SVT which appears to be chronic. Still having black BM    Vital Signs:  Temp (24hrs) Max:37.8 C (100 F)      Systolic (24hrs), Avg:113 , Min:105 , Max:123     Diastolic (24hrs), Avg:67, Min:55, Max:80    Temp  Avg: 37.1 C (98.8 F)  Min: 36.2 C (97.2 F)  Max: 37.8 C (100 F)  Pulse  Avg: 101.9  Min: 93  Max: 108  Resp  Avg: 17.7  Min: 17  Max: 18  SpO2  Avg: 96.1 %  Min: 95 %  Max: 98 %  MAP (Non-Invasive)  Avg: 80.4 mmHG  Min: 71 mmHG  Max: 92 mmHG  Pain Score (Numeric, Faces): 0  Fi02    I/O:  I/O last 24 hours:      Intake/Output Summary (Last 24 hours) at 01/03/16 1507  Last data filed at 01/03/16 1333   Gross per 24 hour   Intake              732 ml   Output              800 ml   Net              -68 ml     I/O current shift:  05/22 0800 - 05/22 1559  In: 600 [P.O.:600]  Out: -   Blood Sugars: Point of Care Glucose Last 24 Hr Results:  No results for input(s): GLUCOSEPOC in the last 24 hours.      Current Facility-Administered Medications:  NS flush syringe 2 mL Intracatheter Q8HRS   And      NS flush syringe 2-6 mL Intracatheter Q1 MIN PRN   pantoprazole (PROTONIX) delayed release tablet 40 mg Oral Q24H   polyethylene glycol (MIRALAX) oral packet 17 g Oral Daily   sennosides-docusate sodium (SENOKOT-S) 8.6-50mg  per tablet 1 Tab Oral 2x/day   tamsulosin (FLOMAX) capsule 0.4 mg Oral Daily after Dinner       No Known Allergies    Physical Exam:  Filed Vitals:    01/03/16 0845 01/03/16 1004 01/03/16 1220 01/03/16 1333   BP:  110/73  114/72   Pulse:  (!) 108  99   Resp:  17  17   Temp:  36.2 C (97.2 F)  37.4 C (99.3 F)   SpO2: 96%  95%      General: appears in good health and no distress  HENT: mmm, nonicteric  Lungs: Clear to auscultation bilaterally.   Cardiovascular: regular rate and rhythm, S1, S2  normal, no murmur  Abdomen: Soft, non-tender, Bowel sounds normal, non-distended  Extremities: no edema or tenderness in the calves   Psychiatric: Normal affect, behavior, memory, thought content, judgement, and speech.      Labs:  Lab Results for Last 24 Hours:    Results for orders placed or performed during the hospital encounter of 12/31/15 (from the past 24 hour(s))   PHOSPHORUS   Result Value Ref Range    PHOSPHORUS 2.5 2.3 - 4.0 mg/dL   MAGNESIUM   Result Value Ref Range    MAGNESIUM 1.5 (L) 1.6 - 2.5 mg/dL   BASIC METABOLIC PANEL   Result Value Ref Range    SODIUM 139 136 - 145 mmol/L    POTASSIUM 3.6  3.5 - 5.1 mmol/L    CHLORIDE 106 96 - 111 mmol/L    CO2 TOTAL 27 22 - 32 mmol/L    ANION GAP 6 4 - 13 mmol/L    CALCIUM 8.2 (L) 8.5 - 10.4 mg/dL    GLUCOSE 96 65 - 161 mg/dL    BUN 13 8 - 25 mg/dL    CREATININE 0.96 0.45 - 1.27 mg/dL    BUN/CREA RATIO 18 6 - 22    ESTIMATED GFR >59 >59 mL/min/1.11m2   CBC WITH DIFF   Result Value Ref Range    WBC 5.9 3.5 - 11.0 x103/uL    RBC 2.79 (L) 4.06 - 5.63 x106/uL    HGB 8.3 (L) 12.5 - 16.3 g/dL    HCT 40.9 (L) 81.1 - 47.0 %    MCV 87.1 78.0 - 100.0 fL    MCH 29.9 27.4 - 33.0 pg    MCHC 34.3 32.5 - 35.8 g/dL    RDW 91.4 78.2 - 95.6 %    PLATELETS 142 140 - 450 x103/uL    MPV 8.8 7.5 - 11.5 fL    NEUTROPHIL % 66 %    LYMPHOCYTE % 16 %    MONOCYTE % 9 %    EOSINOPHIL % 9 %    BASOPHIL % 1 %    NEUTROPHIL # 3.87 1.50 - 7.70 x103/uL    LYMPHOCYTE # 0.92 (L) 1.00 - 4.80 x103/uL    MONOCYTE # 0.55 0.30 - 1.00 x103/uL    EOSINOPHIL # 0.52 (H) 0.00 - 0.50 x103/uL    BASOPHIL # 0.03 0.00 - 0.20 x103/uL       Radiology:    KUB 5/21  Slight increase in mild gaseous distention of colon with bowel gas noted to the level of the rectum, likely representing worsening ileus    Microbiology:    5/20 b culture NGTD    PT/OT: No    Consults: IR    Hardware (lines, foley's, tubes):     Assessment/ Plan:   Active Hospital Problems    Diagnosis    Hypokalemia    BPH (benign  prostatic hyperplasia)    GI bleeding    Secondary thrombocytopenia       Assessment and Plan:  [name] 70 y.o. male with hx of back pain with chronic NSAID use, the patient presented from [facility] s/p endoscopy with posterior duodenal ulcer. He underwent IR embolization here now with stable Hb.    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   - The patient is s/p arterial embolization by IR.   - On PPI drip.   - Hb has been stable   - will test for stool H pylori antigen  - advanced to regular diet      Sinus tachycardia:  - appears to be chronic in nature   - ECG : NSR.   - had runs of SVT with no symptoms.   - the patient on tele, will continue to monitor.     Ileus  - seen on KUB  - f/u KUB showed worsening ileus   - abd soft with good BS and tolerating po  - started Miralax and senokot   - follow clinically    Hypomagnesemia:  - replaced     Chronic issues:   HLD: Simvastatin 20 mg   BPH: Flomax 0.4   Dementia: Donepezil 10 mg   Depression: Venlafaxine 37.5 mg TID  Renelda MomYasser Kabbani., MD        I saw and examined the patient.  I reviewed the resident's note.  I agree with the findings and plan of care as documented in the resident's note.  Any exceptions/additions are edited/noted.  Doing well  Check for h pylori  [name], MD

## 2016-01-03 NOTE — Care Management Notes (Signed)
[facility]  Care Management Note    Patient Name: Henry LiBruce Barber  Date of Birth: 07/11/1946  Sex: male  Date/Time of Admission: 12/31/2015  9:20 AM  Room/Bed: 07/A  Payor: MEDICARE / Plan: MEDICARE PART A / Product Type: Medicare /    LOS: 3 days   PCP: No Established Pcp    Admitting Diagnosis:  GI bleeding [K92.2]    Assessment:      01/03/16 1033   Assessment Details   Assessment Type Continued Assessment   Date of Care Management Update 01/03/16   Date of Next DCP Update 01/06/16   Care Management Plan   Discharge Planning Status plan in progress   Projected Discharge Date 01/05/16       Discharge Plan:  Home (Patient/Family Member/other) (code 1)  MSW following for discharge. 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 Monitoring hgb. Pain control. Will follow for discharge needs     The patient will continue to be evaluated for developing discharge needs.     Case Manager: Theophilus KindsKristen Kimla Furth, MSW  Phone: [phone]

## 2016-01-03 NOTE — Care Plan (Signed)
Problem: General Plan of Care(Adult,OB)  Goal: Plan of Care Review(Adult,OB)  The patient and/or their representative will communicate an understanding of their plan of care   Outcome: Ongoing (see interventions/notes)  Goal: Individualization/Patient Specific Goal(Adult/OB)  Outcome: Ongoing (see interventions/notes)    Problem: Fall Risk (Adult)  Goal: Identify Related Risk Factors and Signs and Symptoms  Related risk factors and signs and symptoms are identified upon initiation of Human Response Clinical Practice Guideline (CPG)   Outcome: Completed Date Met:  01/03/16  Goal: Absence of Falls  Patient will demonstrate the desired outcomes by discharge/transition of care.   Outcome: Ongoing (see interventions/notes)    Problem: Pressure Ulcer Risk (Braden Scale) (Adult,Obstetrics,Pediatric)  Goal: Identify Related Risk Factors and Signs and Symptoms  Related risk factors and signs and symptoms are identified upon initiation of Human Response Clinical Practice Guideline (CPG)   Outcome: Completed Date Met:  01/03/16  Goal: Skin Integrity  Patient will demonstrate the desired outcomes by discharge/transition of care.   Outcome: Ongoing (see interventions/notes)    Problem: Ventilation, Mechanical Invasive (Adult)  Prevent and manage potential problems including:1. artificial airway induced skin/tissue breakdown2. gastritis/stress ulcer3. immobility4. inability to wean5. malnutrition6. mechanical dysfunction7. situational response8. ventilator- induced lung injury   Goal: Signs and Symptoms of Listed Potential Problems Will be Absent or Manageable (Ventilation, Mechanical Invasive)  Signs and symptoms of listed potential problems will be absent or manageable by discharge/transition of care (reference Ventilation, Mechanical Invasive (Adult) CPG).   Outcome: Ongoing (see interventions/notes)    Problem: Gastrointestinal Bleeding (Adult)  Prevent and manage potential problems including:1. fluid imbalance2. hemorrhage3.  hypoxia/hypoxemia4. peritonitis5. situational response   Goal: Signs and Symptoms of Listed Potential Problems Will be Absent or Manageable (Gastrointestinal Bleeding)  Signs and symptoms of listed potential problems will be absent or manageable by discharge/transition of care (reference Gastrointestinal Bleeding (Adult) CPG).   Outcome: Ongoing (see interventions/notes)    Comments:   Patient has felt good today, no complaints of pain. Diet increased to regular and tolerated well. Wife at bedside, Call light in reach, will continue to monitor.

## 2016-01-04 ENCOUNTER — Inpatient Hospital Stay (HOSPITAL_COMMUNITY): Payer: Medicare Other

## 2016-01-04 DIAGNOSIS — K567 Ileus, unspecified: Secondary | ICD-10-CM

## 2016-01-04 LAB — BASIC METABOLIC PANEL
ANION GAP: 4 mmol/L (ref 4–13)
BUN/CREA RATIO: 17 (ref 6–22)
BUN: 14 mg/dL (ref 8–25)
CALCIUM: 8.1 mg/dL — ABNORMAL LOW (ref 8.5–10.4)
CHLORIDE: 106 mmol/L (ref 96–111)
CO2 TOTAL: 27 mmol/L (ref 22–32)
CREATININE: 0.82 mg/dL (ref 0.62–1.27)
ESTIMATED GFR: >59 mL/min/1.73mˆ2 (ref >59)
GLUCOSE: 97 mg/dL (ref 65–139)
POTASSIUM: 3.7 mmol/L (ref 3.5–5.1)
SODIUM: 137 mmol/L (ref 136–145)
SODIUM: 137 mmol/L (ref 136–145)

## 2016-01-04 LAB — CBC WITH DIFF
BASOPHIL #: 0.04 10*3/uL (ref 0.00–0.20)
BASOPHIL #: 0.04 x10ˆ3/uL (ref 0.00–0.20)
BASOPHIL %: 1 %
EOSINOPHIL #: 0.45 x10ˆ3/uL (ref 0.00–0.50)
EOSINOPHIL %: 8 %
HCT: 21.8 % — ABNORMAL LOW (ref 36.7–47.0)
HGB: 7.4 g/dL — ABNORMAL LOW (ref 12.5–16.3)
LYMPHOCYTE #: 1.11 x10ˆ3/uL (ref 1.00–4.80)
LYMPHOCYTE %: 19 %
MCH: 29.6 pg (ref 27.4–33.0)
MCHC: 33.8 g/dL (ref 32.5–35.8)
MCV: 87.7 fL (ref 78.0–100.0)
MONOCYTE #: 0.64 x10ˆ3/uL (ref 0.30–1.00)
MONOCYTE %: 11 %
MPV: 8 fL (ref 7.5–11.5)
NEUTROPHIL #: 3.64 x10ˆ3/uL (ref 1.50–7.70)
NEUTROPHIL %: 62 %
PLATELETS: 160 x10ˆ3/uL (ref 140–450)
RBC: 2.49 x10ˆ6/uL — ABNORMAL LOW (ref 4.06–5.63)
RDW: 14.3 % (ref 12.0–15.0)
WBC: 5.9 x10ˆ3/uL (ref 3.5–11.0)

## 2016-01-04 LAB — IRON TRANSFERRIN AND TIBC
IRON (TRANSFERRIN) SATURATION: 11 % — ABNORMAL LOW (ref 20–50)
IRON: 21 ug/dL — ABNORMAL LOW (ref 55–175)
TOTAL IRON BINDING CAPACITY: 183 ug/dL — ABNORMAL LOW (ref 210–330)
TRANSFERRIN: 131 mg/dL — ABNORMAL LOW (ref 160–340)

## 2016-01-04 LAB — H & H
HCT: 23.7 % — ABNORMAL LOW (ref 36.7–47.0)
HCT: 27.1 % — ABNORMAL LOW (ref 36.7–47.0)
HGB: 7.9 g/dL — ABNORMAL LOW (ref 12.5–16.3)
HGB: 9 g/dL — ABNORMAL LOW (ref 12.5–16.3)

## 2016-01-04 LAB — MAGNESIUM: MAGNESIUM: 1.6 mg/dL (ref 1.6–2.5)

## 2016-01-04 LAB — PHOSPHORUS: PHOSPHORUS: 3.2 mg/dL (ref 2.3–4.0)

## 2016-01-04 LAB — FERRITIN: FERRITIN: 349 ng/mL — ABNORMAL HIGH (ref 20–300)

## 2016-01-04 MED ORDER — PANTOPRAZOLE 40 MG TABLET,DELAYED RELEASE
40.00 mg | DELAYED_RELEASE_TABLET | Freq: Two times a day (BID) | ORAL | 1 refills | Status: DC
Start: 2016-01-04 — End: 2017-08-01

## 2016-01-04 MED ORDER — PANTOPRAZOLE 40 MG TABLET,DELAYED RELEASE
40.00 mg | DELAYED_RELEASE_TABLET | Freq: Two times a day (BID) | ORAL | 1 refills | Status: DC
Start: 2016-01-04 — End: 2016-01-04

## 2016-01-04 MED ORDER — PANTOPRAZOLE 40 MG TABLET,DELAYED RELEASE
40.00 mg | DELAYED_RELEASE_TABLET | ORAL | 1 refills | Status: DC
Start: 2016-01-04 — End: 2016-01-04

## 2016-01-04 NOTE — Nurses Notes (Signed)
Patient and wife given AVS and reviewed.They verbalized understanding that he is to make an appointment with his PCP for one week and his H&H needs checked at that time.They also verbalized understanding that he is to make an appointment with a gastroenterologist for follow-up and a colonoscopy.They verbalized understanding of food restrictions,signs of further GI bleeding and the use of the new medication he was ordered. They had no further questions. He was D/C ambulatory with his wife.

## 2016-01-04 NOTE — Discharge Summary (Addendum)
DISCHARGE SUMMARY      PATIENT NAMEAaliyah, [name]  MRN:  [id number]  DOB:  [birth year]    ADMISSION DATE:  12/31/2015  DISCHARGE DATE:  01/04/2016    ATTENDING PHYSICIAN: Thurnell Garbe.*  SERVICE: MEDICINE 5  PRIMARY CARE PHYSICIAN: No Established Pcp     Reason for Admission     Diagnosis        GI bleeding [96340]          DISCHARGE DIAGNOSIS:     Principle Problem:  Duodenal ulcer bleeding     Active Hospital Problems    Diagnosis Date Noted    Hypokalemia 01/02/2016    BPH (benign prostatic hyperplasia) 01/01/2016    GI bleeding 12/31/2015    Secondary thrombocytopenia 12/31/2015      Resolved Hospital Problems    Diagnosis    No resolved problems to display.     There are no active non-hospital problems to display for this patient.     No Known Allergies     DISCHARGE MEDICATIONS:     Current Discharge Medication List      START taking these medications.       Details    pantoprazole 40 mg Tablet, Delayed Release (E.C.)   Commonly known as:  PROTONIX    40 mg, Oral, 2x/day   Qty:  60 Tab   Refills:  1         CONTINUE these medications - NO CHANGES were made during your visit.       Details    donepezil 5 mg Tablet   Commonly known as:  ARICEPT    10 mg, Oral, NIGHTLY   Refills:  0       simvastatin 20 mg Tablet   Commonly known as:  ZOCOR    20 mg, Oral, QPM   Refills:  0       tamsulosin 0.4 mg Capsule, Sust. Release 24 hr   Commonly known as:  FLOMAX    0.4 mg, Oral, Daily after Dinner   Refills:  0       venlafaxine 37.5 mg Tablet   Commonly known as:  EFFEXOR    37.5 mg, Oral, 3x/day   Refills:  0           Discharge med list refreshed?  YES    During this hospitalization did the patient have an AMI, PCI/PCTA, STENT or Isolated CABG?  No                DISCHARGE INSTRUCTIONS:      DISCHARGE INSTRUCTION - MISC   - Please start taking Protonix 40 mg twice daily to prevent further ulcer bleeding   - please start bland diet and avoid spicy food at the time being   - Please follow up with your PCP  within a week to monitor your hemoglobin levels and to refer you to a gastroenterologist for possible colonoscopy   - Feel free to call with questions or concerns, please return the ED or call 911 if your symptoms worsens or fail to improve.  - If there is any problem with your follow up, please call us so we can have one scheduled with [place].            REASON FOR HOSPITALIZATION AND HOSPITAL COURSE:  This is a 70 y.o., male  with PMH of chronic back pain and chronic SVT 2/2 to mitral valve prolapse who presented as transfer from [facility] to  [facility] with uncontrolled duodenal ulcer bleeding. Patient was intubated at outside facility for airway protection and transfused 8 units of RBC's. EGD over there showed posterior duodenal ulcer with active pulsatile artery that was treated with endo clip and injection of epinephrine. Upon arrival Hgb was 5.9 so IR performed embolization of the gastroduodenal artery and he was placed on Protonix drip, then Hgb stabilized and patient was extubated on 5/20 to room air without issues. Patient reported fairly heavy chronic use of ibuprofen for his back pain for the last 30 years and recent traveling to [place] for a month prior to arrival as well. He was transferred to floor and Hgb was stable over his stay at (8.5 - 9). Serial KUB showed ileus that improved with Miralax and senokot. Stool H. Pylori antigen is pending upon discharge. Patient was placed on Protonix 40 mg BID and was instructed to f/u with his PCP within a week to monitor his Hgb and to refer him to a Customer service managerlocal Gastroenterologist. He has a f/u appointment with Dr Garnette Gunnerollin Cullen (PCP) on Tues Jan 11, 2016 at 2:30 pm. The discharge summary was faxed to his office.      CONDITION ON DISCHARGE:  A. Ambulation: Full ambulation  B. Self-care Ability: Complete  C. Cognitive Status Alert and Oriented x 3  D. DNR status at discharge: Full Code    Advance Directive Information       Most Recent Value    Does the  Patient have an Advance Directive? Yes, Patient Does Have Advance Directive for Healthcare Treatment    Type of Advance Directive Completed *Appointed Health Care Surrogate    Copy of Advance Directives in Chart? Yes, Copy on Chart.(Specify in Comment Which Advance Directive)    Name of MPOA or Healthcare Surrogate Henry RedClaire Barber (spouse)    Phone Number of MPOA or Healthcare Surrogate [phone]          DISCHARGE DISPOSITION:  Home discharge          Pending/Ordered Tests/Procedures:  H. Pylori stool Ag     Pending/Ordered Referrals/Follow-up:  None             Renelda MomYasser Kabbani, MD        Copies sent to Care Team       Relationship Specialty Notifications Start End    Pcp, No Established PCP - General   12/31/15           Referring providers can utilize [email or web address] to access their referred [facility] patient's information.

## 2016-01-04 NOTE — Progress Notes (Addendum)
[facility]  Medicine Progress Note  Full Code    [name]  Date of service: 01/04/2016    Subjective:  No acute events overnight. Hgb has been fluctuating but stable currently at 9. Patient reports feeling better, still having black BM's     Vital Signs:  Temp (24hrs) Max:37.5 C (99.5 F)      Systolic (24hrs), Avg:107 , Min:96 , Max:116     Diastolic (24hrs), Avg:64, Min:58, Max:72    Temp  Avg: 36.9 C (98.5 F)  Min: 36.6 C (97.9 F)  Max: 37.5 C (99.5 F)  Pulse  Avg: 90.8  Min: 82  Max: 105  Resp  Avg: 17.2  Min: 17  Max: 18  SpO2  Avg: 96.5 %  Min: 96 %  Max: 97 %  MAP (Non-Invasive)  Avg: 83 mmHG  Min: 79 mmHG  Max: 87 mmHG  Pain Score (Numeric, Faces): 0  Fi02    I/O:  I/O last 24 hours:      Intake/Output Summary (Last 24 hours) at 01/04/16 1403  Last data filed at 01/04/16 1300   Gross per 24 hour   Intake              960 ml   Output              700 ml   Net              260 ml     I/O current shift:  05/23 0800 - 05/23 1559  In: 720 [P.O.:720]  Out: -   Blood Sugars: Point of Care Glucose Last 24 Hr Results:  No results for input(s): GLUCOSEPOC in the last 24 hours.      Current Facility-Administered Medications:  donepezil (ARICEPT) tablet 10 mg Oral NIGHTLY   NS flush syringe 2 mL Intracatheter Q8HRS   And      NS flush syringe 2-6 mL Intracatheter Q1 MIN PRN   pantoprazole (PROTONIX) delayed release tablet 40 mg Oral Q24H   polyethylene glycol (MIRALAX) oral packet 17 g Oral Daily   sennosides-docusate sodium (SENOKOT-S) 8.6-50mg  per tablet 1 Tab Oral 2x/day   simvastatin (ZOCOR) tablet 20 mg Oral QPM   tamsulosin (FLOMAX) capsule 0.4 mg Oral Daily after Dinner   venlafaxine [facility]) tablet 37.5 mg Oral 3x/day       No Known Allergies    Physical Exam:  Filed Vitals:    01/04/16 0718 01/04/16 0859 01/04/16 1010 01/04/16 1355   BP:   115/64 98/66   Pulse:   90 93   Resp:   17 17   Temp:   36.6 C (97.9 F) 36.8 C (98.2 F)   SpO2: 96% 97%       General: appears in good health and no  distress  HENT: mmm, nonicteric  Lungs: Clear to auscultation bilaterally.   Cardiovascular: regular rate and rhythm, S1, S2 normal, no murmur  Abdomen: Soft, non-tender, Bowel sounds normal, non-distended  Extremities: no edema or tenderness in the calves   Psychiatric: Normal affect, behavior, memory, thought content, judgement, and speech.      Labs:  Lab Results for Last 24 Hours:    Results for orders placed or performed during the hospital encounter of 12/31/15 (from the past 24 hour(s))   H & H   Result Value Ref Range    HGB 9.2 (L) 12.5 - 16.3 g/dL    HCT 16.1 (L) 09.6 - 47.0 %   BASIC METABOLIC PANEL  Result Value Ref Range    SODIUM 137 136 - 145 mmol/L    POTASSIUM 3.7 3.5 - 5.1 mmol/L    CHLORIDE 106 96 - 111 mmol/L    CO2 TOTAL 27 22 - 32 mmol/L    ANION GAP 4 4 - 13 mmol/L    CALCIUM 8.1 (L) 8.5 - 10.4 mg/dL    GLUCOSE 97 65 - 621 mg/dL    BUN 14 8 - 25 mg/dL    CREATININE 3.08 6.57 - 1.27 mg/dL    BUN/CREA RATIO 17 6 - 22    ESTIMATED GFR >59 >59 mL/min/1.43m2   MAGNESIUM   Result Value Ref Range    MAGNESIUM 1.6 1.6 - 2.5 mg/dL   PHOSPHORUS   Result Value Ref Range    PHOSPHORUS 3.2 2.3 - 4.0 mg/dL   CBC WITH DIFF   Result Value Ref Range    WBC 5.9 3.5 - 11.0 x103/uL    RBC 2.49 (L) 4.06 - 5.63 x106/uL    HGB 7.4 (L) 12.5 - 16.3 g/dL    HCT 84.6 (L) 96.2 - 47.0 %    MCV 87.7 78.0 - 100.0 fL    MCH 29.6 27.4 - 33.0 pg    MCHC 33.8 32.5 - 35.8 g/dL    RDW 95.2 84.1 - 32.4 %    PLATELETS 160 140 - 450 x103/uL    MPV 8.0 7.5 - 11.5 fL    NEUTROPHIL % 62 %    LYMPHOCYTE % 19 %    MONOCYTE % 11 %    EOSINOPHIL % 8 %    BASOPHIL % 1 %    NEUTROPHIL # 3.64 1.50 - 7.70 x103/uL    LYMPHOCYTE # 1.11 1.00 - 4.80 x103/uL    MONOCYTE # 0.64 0.30 - 1.00 x103/uL    EOSINOPHIL # 0.45 0.00 - 0.50 x103/uL    BASOPHIL # 0.04 0.00 - 0.20 x103/uL   H & H   Result Value Ref Range    HGB 7.9 (L) 12.5 - 16.3 g/dL    HCT 40.1 (L) 02.7 - 47.0 %   H & H   Result Value Ref Range    HGB 9.0 (L) 12.5 - 16.3 g/dL    HCT  25.3 (L) 66.4 - 47.0 %   IRON TRANSFERRIN AND TIBC   Result Value Ref Range    TOTAL IRON BINDING CAPACITY 183 (L) 210 - 330 ug/dL    IRON (TRANSFERRIN) SATURATION 11 (L) 20 - 50 %    IRON 21 (L) 55 - 175 ug/dL    TRANSFERRIN 403 (L) 160 - 340 mg/dL       Radiology:    KUB 5/21  Slight increase in mild gaseous distention of colon with bowel gas noted to the level of the rectum, likely representing worsening ileus    Microbiology:    5/20 b culture NGTD    PT/OT: No    Consults: IR    Hardware (lines, foley's, tubes):     Assessment/ Plan:   Active Hospital Problems    Diagnosis    Hypokalemia    BPH (benign prostatic hyperplasia)    GI bleeding    Secondary thrombocytopenia       Assessment and Plan:  [name] 70 y.o. male with hx of back pain with chronic NSAID use, the patient presented from [facility] s/p endoscopy with posterior duodenal ulcer. He underwent IR embolization here now with stable Hb.    Upper GI  Bleeding Secondary To Duodenal Ulcer:   - hx of NSAID use.   - The patient is s/p arterial embolization by IR.   - On PPI  - Hb has been stable   - stool H pylori antigen pending   - iron studies pending   - regular diet    - f/u with GI as outpatient     Sinus tachycardia:  - appears to be chronic in nature   - ECG : NSR.   - had runs of SVT with no symptoms.   - the patient on tele, will continue to monitor.     Ileus  - f/u KUB showed ileus improving   - abd soft with good BS and tolerating po  - continue Miralax and Senokot   - follow clinically      Chronic issues:   HLD: Simvastatin 20 mg   BPH: Flomax 0.4   Dementia: Donepezil 10 mg   Depression: Venlafaxine 37.5 mg TID        Renelda MomYasser Kabbani., MD            I saw and examined the patient.  I reviewed the resident's note.  I agree with the findings and plan of care as documented in the resident's note.  Any exceptions/additions are edited/noted.  Hb stable  [name], MD

## 2016-01-04 NOTE — Nurses Notes (Signed)
Patient hgb-7.4 ;hct-21.8.,paged & notified Md,awaiting for further order.Marland Kitchen.Marland Kitchen..Marland Kitchen

## 2016-01-05 LAB — ADULT ROUTINE BLOOD CULTURE, SET OF 2 BOTTLES (BACTERIA AND YEAST)
BLOOD CULTURE, ROUTINE: NO GROWTH
BLOOD CULTURE, ROUTINE: NO GROWTH

## 2016-01-05 LAB — HELICOBACTER PYLORI AG, BY EIA: HELICOBACTER PYLORI ANTIGEN, FECES: NEGATIVE

## 2016-02-25 ENCOUNTER — Encounter (FREE_STANDING_LABORATORY_FACILITY)
Admit: 2016-02-25 | Discharge: 2016-02-25 | Disposition: A | Discharge status: Home / Self Care | Payer: Medicare Other | Attending: EXTERNAL | Admitting: EXTERNAL

## 2016-02-25 DIAGNOSIS — K2981 Duodenitis with bleeding: Secondary | ICD-10-CM

## 2016-02-29 LAB — HISTORICAL SURGICAL PATHOLOGY SPECIMEN

## 2016-04-28 DIAGNOSIS — I351 Nonrheumatic aortic (valve) insufficiency: Secondary | ICD-10-CM

## 2017-06-22 DIAGNOSIS — Z23 Encounter for immunization: Secondary | ICD-10-CM

## 2017-06-22 DIAGNOSIS — G4733 Obstructive sleep apnea (adult) (pediatric): Secondary | ICD-10-CM

## 2017-06-22 DIAGNOSIS — Z0000 Encounter for general adult medical examination without abnormal findings: Secondary | ICD-10-CM

## 2017-06-22 DIAGNOSIS — E782 Mixed hyperlipidemia: Secondary | ICD-10-CM

## 2017-06-22 DIAGNOSIS — R413 Other amnesia: Secondary | ICD-10-CM

## 2017-06-25 ENCOUNTER — Encounter (INDEPENDENT_AMBULATORY_CARE_PROVIDER_SITE_OTHER): Payer: Self-pay | Admitting: Family Medicine

## 2017-06-25 ENCOUNTER — Other Ambulatory Visit (INDEPENDENT_AMBULATORY_CARE_PROVIDER_SITE_OTHER): Payer: Self-pay | Admitting: Family Medicine

## 2017-06-25 LAB — ENTER/EDIT EXTERNAL COMMON LAB RESULTS
ALBUMIN (SERUM): 4.2
ALKALINE PHOSPHATASE: 50
ALT (SGPT): 21
AST (SGOT): 30
BILIRUBIN, TOTAL: 4.2
BUN: 20
CALCIUM: 9.5
CARBON DIOXIDE: 32
CHLORIDE: 97
CHOLESTEROL: 174
CREATININE: 0.9
GLUCOSE, FASTING: 89
HCT: 42.1
HDL-CHOLESTEROL: 63
HGB: 14.1
LDL (CALCULATED): 101
PLATELET COUNT: 242
POTASSIUM: 4.5
PROSTATE SPECIFIC AG: 3.7
SODIUM: 133
THYROXINE, TOTAL T4: 0.96
TRIGLYCERIDES: 50
TSH: 1.64
WBC: 5.13

## 2017-08-01 ENCOUNTER — Ambulatory Visit (INDEPENDENT_AMBULATORY_CARE_PROVIDER_SITE_OTHER): Payer: Self-pay | Admitting: Family Medicine

## 2017-08-01 NOTE — Telephone Encounter (Signed)
Regarding: refill request   ----- Message from Pieter PartridgeMelissa Ann Warnick sent at 08/01/2017 12:57 PM EST -----  Pt wife  called to see you would please call in refills for pt     They are in [place] currently    Pantoprazole 40 mg 1x daily    celebrex 200 mg (This was prescribed by another doctor)    Pt wife states that pt is having memory issues and was prescribed a new medication that she would like to discuss with Dr. Laural BenesJohnson or his nurse    Pharmacy info:  [facility]  [phone]  [place]   Phone: [phone]

## 2017-08-02 MED ORDER — PANTOPRAZOLE 40 MG TABLET,DELAYED RELEASE
40.00 mg | DELAYED_RELEASE_TABLET | Freq: Two times a day (BID) | ORAL | 1 refills | Status: DC
Start: 2017-08-02 — End: 2017-10-10

## 2017-08-13 ENCOUNTER — Other Ambulatory Visit (INDEPENDENT_AMBULATORY_CARE_PROVIDER_SITE_OTHER): Payer: Self-pay | Admitting: Family Medicine

## 2017-08-13 ENCOUNTER — Ambulatory Visit (INDEPENDENT_AMBULATORY_CARE_PROVIDER_SITE_OTHER): Payer: Self-pay | Admitting: Family Medicine

## 2017-08-13 MED ORDER — CELECOXIB 200 MG CAPSULE
200.0000 mg | ORAL_CAPSULE | Freq: Every day | ORAL | 3 refills | Status: DC
Start: 2017-08-13 — End: 2018-08-16

## 2017-08-13 NOTE — Telephone Encounter (Signed)
Regarding: script request  ----- Message from Melissa Ann Warnick sent at 08/13/2017 10:56 AM EST -----  2nd request-wPieter Partridgehen looking in chart, looks like [facility] was [facility], needs to be sent to pharmacy in [place] (at bottom of message)    They are in [place] currently    Pantoprazole 40 mg 1x daily    celebrex 200 mg (This was prescribed by another doctor)    Pt wife states that pt is having memory issues and was prescribed a new medication that she would like to discuss with Dr. Laural BenesJohnson or his nurse    Pharmacy info:  [facility]  [phone]  [place]   Phone: [phone]

## 2017-08-13 NOTE — Telephone Encounter (Signed)
Prescription was called to pharmacy, message left on prescriber voicemail.   Patient notified via voicemail.   Edison Paceeborah Ysabela Keisler, LPN  [id number]/31/2018, 13:44

## 2017-08-18 ENCOUNTER — Other Ambulatory Visit (INDEPENDENT_AMBULATORY_CARE_PROVIDER_SITE_OTHER): Payer: Self-pay | Admitting: Family Medicine

## 2017-09-10 ENCOUNTER — Ambulatory Visit (INDEPENDENT_AMBULATORY_CARE_PROVIDER_SITE_OTHER): Payer: Self-pay | Admitting: Family Medicine

## 2017-09-10 NOTE — Telephone Encounter (Signed)
Regarding: medication advice  ----- Message from Pieter PartridgeMelissa Ann Warnick sent at 09/10/2017 12:07 PM EST -----  When pt went to get their medication, they was told by pharmacy that there is an OTC version for     pantoprazole (PROTONIX) 40 mg Oral Tablet, Delayed Release (E.C.)     That is much cheaper then insurance    Pt would like to speak to a nurse or doctor about this.

## 2017-09-10 NOTE — Telephone Encounter (Signed)
Patient was notified via voicemail that this medication is available OTC, some insurances won't pay for this med due to it being available OTC. His may be one of those insurances.   It is ok to take the OTC.   Any other questions or concerns, please call.  Edison Paceeborah Onyx Schirmer, LPN  4/78/29561/28/2019, 16:47

## 2017-10-10 ENCOUNTER — Other Ambulatory Visit (INDEPENDENT_AMBULATORY_CARE_PROVIDER_SITE_OTHER): Payer: Self-pay | Admitting: Family Medicine

## 2017-10-15 ENCOUNTER — Telehealth (INDEPENDENT_AMBULATORY_CARE_PROVIDER_SITE_OTHER): Payer: Self-pay | Admitting: Family Medicine

## 2017-10-15 NOTE — Telephone Encounter (Signed)
Rx needed prior auth.  Paper work to [employer]Melissa M for [employer]auth  Annessa Satre, [place]  10/15/2017, 10:48

## 2017-11-26 ENCOUNTER — Other Ambulatory Visit (INDEPENDENT_AMBULATORY_CARE_PROVIDER_SITE_OTHER): Payer: Self-pay | Admitting: Family Medicine

## 2018-01-28 ENCOUNTER — Encounter (INDEPENDENT_AMBULATORY_CARE_PROVIDER_SITE_OTHER): Payer: Self-pay | Admitting: Family Medicine

## 2018-01-28 ENCOUNTER — Ambulatory Visit (INDEPENDENT_AMBULATORY_CARE_PROVIDER_SITE_OTHER): Payer: Self-pay | Admitting: Family Medicine

## 2018-01-28 DIAGNOSIS — R002 Palpitations: Secondary | ICD-10-CM

## 2018-01-28 DIAGNOSIS — K219 Gastro-esophageal reflux disease without esophagitis: Secondary | ICD-10-CM

## 2018-01-28 DIAGNOSIS — G4733 Obstructive sleep apnea (adult) (pediatric): Secondary | ICD-10-CM

## 2018-01-28 DIAGNOSIS — E782 Mixed hyperlipidemia: Secondary | ICD-10-CM

## 2018-01-28 DIAGNOSIS — I480 Paroxysmal atrial fibrillation: Secondary | ICD-10-CM | POA: Insufficient documentation

## 2018-01-28 HISTORY — DX: Obstructive sleep apnea (adult) (pediatric): G47.33

## 2018-01-28 HISTORY — DX: Mixed hyperlipidemia: E78.2

## 2018-01-28 HISTORY — DX: Palpitations: R00.2

## 2018-01-28 HISTORY — DX: Gastro-esophageal reflux disease without esophagitis: K21.9

## 2018-01-28 NOTE — Nursing Note (Signed)
Patient has an appointment scheduled for tomorrow.     Edison Paceeborah Maha Fischel, LPN  5/40/98116/17/2019, 10:21

## 2018-01-28 NOTE — Telephone Encounter (Signed)
Regarding: Patient Consult  ----- Message from Gregor HamsZoe Ruth Lowe sent at 01/24/2018  3:20 PM EDT -----  Lucia Bitterhomas Johnson, MD    Patient's wife Alan RipperClaire called. She states that the patient is having stomach pain, and because of his history with his bleeding ulcer a couple of years ago, it has raised some concerns. She was wondering if Dr. Laural BenesJohnson would want to see the patient first, or order a scope? Please call patient to advise.

## 2018-01-29 ENCOUNTER — Encounter (INDEPENDENT_AMBULATORY_CARE_PROVIDER_SITE_OTHER): Payer: Self-pay | Admitting: Family Medicine

## 2018-01-29 ENCOUNTER — Ambulatory Visit (INDEPENDENT_AMBULATORY_CARE_PROVIDER_SITE_OTHER): Payer: BLUE CROSS/BLUE SHIELD | Admitting: Family Medicine

## 2018-01-29 VITALS — Temp 97.6°F | Wt 167.0 lb

## 2018-01-29 DIAGNOSIS — D6959 Other secondary thrombocytopenia: Secondary | ICD-10-CM

## 2018-01-29 DIAGNOSIS — K279 Peptic ulcer, site unspecified, unspecified as acute or chronic, without hemorrhage or perforation: Principal | ICD-10-CM

## 2018-01-29 DIAGNOSIS — K219 Gastro-esophageal reflux disease without esophagitis: Secondary | ICD-10-CM

## 2018-01-29 NOTE — Progress Notes (Signed)
OUTPATIENT PROGRESS NOTE    Subjective:   Patient ID:  [name] is a pleasant 72 y.o. male.    Chief Complaint: Abdominal Pain (Patient presents today with his wife, with c/o abdominal discomfort.   Hx of bleeding ulcer.)      History of Present Illness:  Peptic ulcer disease  HISTORY OF PEPTIC ULCER DISEASE WITH HEMORRHAGE 2 YEARS AGO, HAS MANTAINED PANTAPROZOL BUT OVER THE PAST FEW MONTHS WITH AM NAUSEA RELIEVED BY FOOD BECOMING MORE APPARENT. NO CHANGE IN BOWEL HABITS. FOOD HELPS      Gastroesophageal reflux disease, esophagitis presence not specified  MORE RECENT DIFFICULTY WITH SWALLOWING AND GETTING FOOD STUCK, NO CHOKING      Secondary thrombocytopenia  LAB WAS NORMAL AT LAST EVALUATION        The history is provided by the patient.      Allergies:   No Known Allergies      Medications:     Outpatient Medications Prior to Visit:  atorvastatin (LIPITOR) 40 mg Oral Tablet TAKE 1 TABLET BY MOUTH EVERY DAY   celecoxib (CELEBREX) 200 mg Oral Capsule Take 1 Cap (200 mg total) by mouth Once a day   donepezil (ARICEPT) 5 mg Oral Tablet Take 10 mg by mouth Every night   pantoprazole (PROTONIX) 40 mg Oral Tablet, Delayed Release (E.C.) TAKE 1 TABLET BY MOUTH TWICE A DAY (Patient taking differently: TAKE 1 TABLET BY MOUTH ONCE A DAY)   tamsulosin (FLOMAX) 0.4 mg Oral Capsule, Sust. Release 24 hr Take 0.4 mg by mouth Every evening after dinner   venlafaxine (EFFEXOR XR) 150 mg Oral Capsule, Sust. Release 24 hr Take 150 mg by mouth Once a day   venlafaxine (EFFEXOR) 37.5 mg Oral Tablet Take 37.5 mg by mouth Three times a day     No facility-administered medications prior to visit.       Immunization History:     Immunization History   Administered Date(s) Administered   . Influenza Vaccine IM (ADMIN) 05/25/2017   . Pneumovax 06/21/2017   . Prevnar 13 (Admin) 06/06/2016         Past Medical History:     Past Medical History:   Diagnosis Date   . Back pain, chronic    . Duodenal ulcer     hx of: acute, hemorrhage   . GERD  (gastroesophageal reflux disease) 01/28/2018   . History of short term memory loss    . Mitral valve prolapse    . 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 Obstructive sleep apnea 01/28/2018   . Palpitations 01/28/2018             Past Surgical History:     Past Surgical History:   Procedure Laterality Date   . COLONOSCOPY  2009   . HX HERNIA REPAIR     . HX VASECTOMY     . LAMINECTOMY               Family History:     Family Medical History:     Problem Relation (Age of Onset)    Aortic Aneurysm Maternal Grandfather    No Known Problems Mother, Father                Social History:   [name]  reports that he has quit smoking. His smoking use included cigarettes. He has a 8.00 pack-year smoking history. He has never used smokeless tobacco. He reports that he drinks alcohol.    Review of  Systems: In addition to HPI  Review of Systems   Constitutional: Negative for chills, diaphoresis and fever.   HENT: Negative for ear pain and nosebleeds.    Eyes: Negative for double vision and discharge.   Respiratory: Negative for hemoptysis.    Cardiovascular: Negative for chest pain and palpitations.   Gastrointestinal: Positive for abdominal pain, heartburn and nausea. Negative for blood in stool, diarrhea, melena and vomiting.   Genitourinary: Negative for hematuria.   Musculoskeletal: Negative for myalgias.   Skin: Negative for rash.   Neurological: Negative for tremors, speech change and focal weakness.   Endo/Heme/Allergies: Does not bruise/bleed easily.   Psychiatric/Behavioral: Negative for hallucinations and memory loss.   All other systems reviewed and are negative.        Objective:   Vitals:    Vitals:    01/29/18 0904   Temp: 36.4 C (97.6 F)   TempSrc: Thermal Scan   SpO2: 90%   Weight: 75.8 kg (167 lb)          Body mass index is 21.43 kg/m.  Physical Exam   Constitutional: He is oriented to person, place, and time and well-developed, well-nourished, and in no distress.   HENT:   Head:  Normocephalic and atraumatic.   Right Ear: External ear normal.   Left Ear: External ear normal.   Mouth/Throat: Oropharynx is clear and moist.   Eyes: Pupils are equal, round, and reactive to light. No scleral icterus.   Neck: Neck supple. No JVD present. No tracheal deviation present. No thyromegaly present.   Cardiovascular: Normal rate, regular rhythm and normal heart sounds.   No murmur heard.  Pulmonary/Chest: Breath sounds normal. He has no rales.   Abdominal: Soft. Bowel sounds are normal. He exhibits no mass.   Musculoskeletal: He exhibits no edema or deformity.   Neurological: He is alert and oriented to person, place, and time.   Skin: Skin is warm and dry.   Psychiatric: Affect normal.   Nursing note and vitals reviewed.      POCT Results:                   I have reviewed and interpreted  the point of care( POCT) results in this note and used this information in my assessment.  Lucia Bitter, MD    There are no exam notes on file for this visit.    Assessment & Plan:     1. Peptic ulcer disease  DISCUSSED INITIAL EVALUATION WITH BARIUM SWALLOW AND UPPER GI AN LAB. USE ANTACIDS PRN. CONSIDER GED IF NOT RESOLVED  - CBC; Future  - IRON STUDIES; Future  - CBC  - IRON STUDIES  - FLUORO ESOPHAGRAM (BA SWALLOW); Future  - FLUORO ESOPHAGRAM (BA SWALLOW)    2. Gastroesophageal reflux disease, esophagitis presence not specified  SEE ABOVE  - CBC; Future  - IRON STUDIES; Future  - CBC  - IRON STUDIES  - FLUORO ESOPHAGRAM (BA SWALLOW); Future  - FLUORO ESOPHAGRAM (BA SWALLOW)    3. Secondary thrombocytopenia  LAB ORDERED  - CBC; Future  - IRON STUDIES; Future  - CBC  - IRON STUDIES    FOLLOW UP DEPENDENT ON LAB RESULTS AND XRAYS  Fall Risk Follow up plan of care: Discussed optimizing home safety        Health Maintenance   Topic Date Due   . Depression Screening  09[birth year]/1955   . Adult Tdap-Td (1 - Tdap) 09/21/1962   . Hepatitis C screening antibody  09/21/1988   . Fecal Occult Blood/FIT Test: High Sensitivity   09/21/1993   . Shingles Vaccine (1 of 2) 09/21/1993   . AAA Screening  09/21/2008   . Pneumococcal 65+ Years Low Risk  Completed   . Influenza Vaccine  Completed     Return if symptoms worsen or fail to improve.  Return in about 3 months (around 05/01/2018) for Follow up.  Lucia Bitterhomas Hilmar Moldovan, MD 01/29/2018 09:31

## 2018-02-17 ENCOUNTER — Other Ambulatory Visit (INDEPENDENT_AMBULATORY_CARE_PROVIDER_SITE_OTHER): Payer: Self-pay | Admitting: Family Medicine

## 2018-05-01 ENCOUNTER — Ambulatory Visit (INDEPENDENT_AMBULATORY_CARE_PROVIDER_SITE_OTHER): Payer: Self-pay | Admitting: Family Medicine

## 2018-05-01 ENCOUNTER — Encounter (INDEPENDENT_AMBULATORY_CARE_PROVIDER_SITE_OTHER): Payer: Self-pay | Admitting: Family Medicine

## 2018-05-01 ENCOUNTER — Ambulatory Visit (INDEPENDENT_AMBULATORY_CARE_PROVIDER_SITE_OTHER): Payer: BLUE CROSS/BLUE SHIELD | Admitting: Family Medicine

## 2018-05-01 VITALS — BP 112/72 | HR 81 | Temp 97.4°F | Ht 74.02 in | Wt 163.0 lb

## 2018-05-01 DIAGNOSIS — G309 Alzheimer's disease, unspecified: Secondary | ICD-10-CM

## 2018-05-01 DIAGNOSIS — F0280 Dementia in other diseases classified elsewhere without behavioral disturbance: Secondary | ICD-10-CM

## 2018-05-01 DIAGNOSIS — E782 Mixed hyperlipidemia: Secondary | ICD-10-CM

## 2018-05-01 DIAGNOSIS — K219 Gastro-esophageal reflux disease without esophagitis: Secondary | ICD-10-CM

## 2018-05-01 DIAGNOSIS — K922 Gastrointestinal hemorrhage, unspecified: Secondary | ICD-10-CM

## 2018-05-01 NOTE — Nursing Note (Signed)
Dr Laural BenesJohnson spoke with patient's wife.  Loraine MapleDiana Tynisa Vohs, MA  05/01/2018, 16:04

## 2018-05-01 NOTE — Progress Notes (Addendum)
OUTPATIENT PROGRESS NOTE    Subjective:   Patient ID:  [name] is a pleasant 72 y.o. male.    Chief Complaint: Stomach Ache (Patient present today with his wife for a follow up. Patient states he has been nauseous. ) and Memory Problems (Patient has had memory problems since 2015)      History of Present Illness:   Gastroesophageal reflux disease, esophagitis presence not specified  DISCUSSED BARIUM SWALLOW AND UGI PERFORMED IN June, NO PATHOLOGY NOTED. PATIENT CONTROLS HIS SX WITH MINIMAL MODIFICATION OF DIET. STABLE AND DOING WELL      Mixed hypercholesterolemia and hypertriglyceridemia  HPI for HYPERLIPIDEMIA  Pt is following low fat, low carb diet.  Patient's treatment regimen includes Diet and Statin   Pt denies understands imprortance of keeping lipids under control to prevent heart disease.  Onset: Years  Severity: Moderate  Associated Comorbidities:        Fatigue  CHRONIC AND SLOWLY PROGRESSIVE. WIFE IS HELPING HIM WITH MANAGING MEDS AND AFFAIRS. DISCUSSED AT LENGTH       The history is provided by the patient.      Allergies:   No Known Allergies      Medications:     Outpatient Medications Prior to Visit:  atorvastatin (LIPITOR) 40 mg Oral Tablet TAKE 1 TABLET BY MOUTH EVERY DAY   celecoxib (CELEBREX) 200 mg Oral Capsule Take 1 Cap (200 mg total) by mouth Once a day   donepezil (ARICEPT) 5 mg Oral Tablet Take 10 mg by mouth Every night   pantoprazole (PROTONIX) 40 mg Oral Tablet, Delayed Release (E.C.) TAKE 1 TABLET BY MOUTH TWICE A DAY (Patient taking differently: TAKE 1 TABLET BY MOUTH ONCE A DAY)   tamsulosin (FLOMAX) 0.4 mg Oral Capsule, Sust. Release 24 hr Take 0.4 mg by mouth Every evening after dinner   venlafaxine (EFFEXOR XR) 150 mg Oral Capsule, Sust. Release 24 hr Take 150 mg by mouth Once a day     No facility-administered medications prior to visit.       Immunization History:     Immunization History   Administered Date(s) Administered   . Influenza Vaccine IM (ADMIN) 05/25/2017   .  Pneumovax 06/21/2017   . Prevnar 13 (Admin) 06/06/2016         Past Medical History:     Past Medical History:   Diagnosis Date   . Back pain, chronic    . Duodenal ulcer     hx of: acute, hemorrhage   . GERD (gastroesophageal reflux disease) 01/28/2018   . History of short term memory loss    . Mitral valve prolapse    . 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 Obstructive sleep apnea 01/28/2018   . Palpitations 01/28/2018             Past Surgical History:     Past Surgical History:   Procedure Laterality Date   . COLONOSCOPY  2009   . HX HERNIA REPAIR     . HX VASECTOMY     . LAMINECTOMY               Family History:     Family Medical History:     Problem Relation (Age of Onset)    Aortic Aneurysm Maternal Grandfather    No Known Problems Mother, Father                Social History:   Daveion Robar  reports that he has quit smoking. His  smoking use included cigarettes. He has a 8.00 pack-year smoking history. He has never used smokeless tobacco. He reports that he drinks alcohol.    Review of Systems: In addition to HPI  Review of Systems   Constitutional: Negative for chills, diaphoresis and fever.   HENT: Negative for ear pain and nosebleeds.    Eyes: Negative for double vision and discharge.   Respiratory: Negative for hemoptysis.    Cardiovascular: Negative for chest pain and palpitations.   Gastrointestinal: Negative for blood in stool and melena.        RARE INDIGESTION   Genitourinary: Negative for hematuria.   Musculoskeletal: Negative for myalgias.   Skin: Negative for rash.   Neurological: Negative for tremors, speech change and focal weakness.   Endo/Heme/Allergies: Does not bruise/bleed easily.   Psychiatric/Behavioral: Negative for hallucinations and memory loss.   All other systems reviewed and are negative.        Objective:   Vitals:    Vitals:    05/01/18 1019   BP: 112/72   Pulse: 81   Temp: 36.3 C (97.4 F)   TempSrc: Thermal Scan   SpO2: 97%   Weight: 73.9 kg (163 lb)      Height: 1.88 m (6' 2.02")   BMI: 20.96          Body mass index is 20.92 kg/m.  Physical Exam   Constitutional: He is oriented to person, place, and time and well-developed, well-nourished, and in no distress.   HENT:   Head: Normocephalic and atraumatic.   Right Ear: External ear normal.   Left Ear: External ear normal.   Mouth/Throat: Oropharynx is clear and moist.   Eyes: Pupils are equal, round, and reactive to light. No scleral icterus.   Neck: Neck supple. No JVD present. No tracheal deviation present. No thyromegaly present.   Cardiovascular: Normal rate, regular rhythm and normal heart sounds.   No murmur heard.  Pulmonary/Chest: Breath sounds normal. He has no rales.   Abdominal: Soft. Bowel sounds are normal. He exhibits no mass.   Musculoskeletal: He exhibits no edema or deformity.   Neurological: He is alert and oriented to person, place, and time.   Skin: Skin is warm and dry.   Psychiatric: Affect normal.   Nursing note and vitals reviewed.      POCT Results:                   I have reviewed and interpreted  the point of care( POCT) results in this note and used this information in my assessment. Interperation: None performed Today  Lucia Bitter, MD    There are no exam notes on file for this visit.    Assessment & Plan:     1. Gastroesophageal reflux disease, esophagitis presence not specified  CONTINUE TO MONITOR SYMPTOMS AND WILL CONSIDER FURTHER EVALUATION IF SX WORSEN    2. Mixed hypercholesterolemia and hypertriglyceridemia  ASSESSMENT  OF PROBLEM  HYPERLIPIDEMIA  ASSESSMENT  OF PROBLEM  Controlled  Stable    CMP  Lab Results   Component Value Date/Time    CALCIUM 9.5 06/23/2017    PHOSPHORUS 3.2 01/04/2016 03:40 AM    ALBUMIN 2.5 (L) 01/01/2016 11:34 AM    TOTALPROTEIN 4.0 (L) 12/31/2015 09:30 AM    ALKPHOS 50 06/23/2017    AST 30 06/23/2017    ALT 21 06/23/2017    TOTBILIRUBIN 4.2 06/23/2017     LIPIDS  Lab Results   Component Value Date  CHOLESTEROL 174 06/23/2017    HDLCHOL 63  06/23/2017    LDLCHOL 101 06/23/2017    TRIG 50 06/23/2017      Continue low fat, low card diet.  Call if you develop any muscle aches.  Continue your medication for Hyperlipidemia.  Get Lipid panel every 6-12 months.     - CBC; Future  - COMPREHENSIVE METABOLIC PANEL, NON-FASTING; Future  - LIPID PANEL; Future  - CBC  - LIPID PANEL  - COMPREHENSIVE METABOLIC PANEL, NON-FASTING    Fatigue     NO CHANGE, MONITOR  - COMPREHENSIVE METABOLIC PANEL, NON-FASTING; Future  - THYROID STIMULATING HORMONE WITH FREE T4 REFLEX; Future  - THYROID STIMULATING HORMONE WITH FREE T4 REFLEX  - COMPREHENSIVE METABOLIC PANEL, NON-FASTING             Health Maintenance   Topic Date Due   . Hepatitis C screening antibody  09/21/1988   . Fecal Occult Blood/FIT Test: High Sensitivity  09/21/1993   . Shingles Vaccine (1 of 2) 09/21/1993   . AAA Screening  09/21/2008   . Influenza Vaccine (1) 04/14/2018   . Depression Screening  01/30/2019   . Adult Tdap-Td (2 - Td) 01/27/2025   . Pneumococcal 65+ Years Low Risk  Completed     Return if symptoms worsen or fail to improve.  Return in about 3 months (around 07/31/2018) for medicare wellness.  Lucia Bitterhomas Daymeon Fischman, MD 05/01/2018 10:36

## 2018-05-01 NOTE — Telephone Encounter (Signed)
Regarding: Message  ----- Message from Sherril Congarol L Ashby sent at 05/01/2018 10:58 AM EDT -----  Lafonda Mossesiana the patient's wife would like you to call her back regarding the office visit summary, she had a question.

## 2018-05-28 ENCOUNTER — Other Ambulatory Visit (INDEPENDENT_AMBULATORY_CARE_PROVIDER_SITE_OTHER): Payer: Self-pay | Admitting: Family Medicine

## 2018-06-28 ENCOUNTER — Ambulatory Visit (INDEPENDENT_AMBULATORY_CARE_PROVIDER_SITE_OTHER): Payer: Self-pay | Admitting: Family Medicine

## 2018-06-28 DIAGNOSIS — R5383 Other fatigue: Secondary | ICD-10-CM

## 2018-06-28 DIAGNOSIS — D6959 Other secondary thrombocytopenia: Secondary | ICD-10-CM

## 2018-06-28 DIAGNOSIS — E782 Mixed hyperlipidemia: Secondary | ICD-10-CM

## 2018-06-28 DIAGNOSIS — E876 Hypokalemia: Secondary | ICD-10-CM

## 2018-06-28 LAB — LIPID PANEL
CHOLESTEROL: 166
HDL-CHOLESTEROL: 64
LDL (CALCULATED): 94
TRIGLYCERIDES: 42
VLDL (CALCULATED): 8

## 2018-06-28 NOTE — Telephone Encounter (Signed)
-----   Message from Kieth BrightlySabrina P Hinebaugh sent at 06/27/2018 11:01 AM EST -----  Pt wife would like you to call her regarding the diagnosis on the pt lab orders   Can you please call her   Pt wife states she wants some of the codes taken off the orders because there was never a diagnosis from the neurologist     TJ PT

## 2018-07-01 ENCOUNTER — Ambulatory Visit (INDEPENDENT_AMBULATORY_CARE_PROVIDER_SITE_OTHER): Payer: BLUE CROSS/BLUE SHIELD | Admitting: Family Medicine

## 2018-07-01 ENCOUNTER — Encounter (INDEPENDENT_AMBULATORY_CARE_PROVIDER_SITE_OTHER): Payer: Self-pay | Admitting: Family Medicine

## 2018-07-01 VITALS — BP 130/70 | HR 100 | Temp 97.5°F | Ht 73.62 in | Wt 164.0 lb

## 2018-07-01 DIAGNOSIS — Z23 Encounter for immunization: Secondary | ICD-10-CM

## 2018-07-01 DIAGNOSIS — E782 Mixed hyperlipidemia: Secondary | ICD-10-CM

## 2018-07-01 DIAGNOSIS — Z0000 Encounter for general adult medical examination without abnormal findings: Principal | ICD-10-CM

## 2018-07-01 MED ORDER — ATORVASTATIN 40 MG TABLET
40.00 mg | ORAL_TABLET | Freq: Every evening | ORAL | 4 refills | Status: DC
Start: 2018-07-01 — End: 2018-09-11

## 2018-07-01 NOTE — Progress Notes (Signed)
[facility] [facility], [place]  [address]  [place] [id number]  [facility]  Medicare Annual Wellness Visit    Name: Stephannie LiBruce Lipinski MRN:  [id number]   Date: 07/01/2018 Age: 72 y.o.       SUBJECTIVE:   Stephannie LiBruce Saladin is a 72 y.o. male for presenting for subsequent Medicare Wellness exam.   I have reviewed and reconciled the medication list with the patient today.  Encounter for Medicare annual wellness exam  Discussed importance of changing eating habits including portion control and food choices emphasizing nutricious foods and avoiding sugary and processed foods that have too many calories and little nutrition.      Need for influenza vaccination  Vaccine counselling for all components completed.     Mixed hypercholesterolemia and hypertriglyceridemia   HPI for HYPERLIPIDEMIA  Pt is following low fat, low carb diet.  Patient's treatment regimen includes Diet and Statin now tolerating atorvastatin with improved response closer to goal  Pt understands imprortance of keeping lipids under control to prevent heart and vascular disease.  Onset: Years  Severity: Moderate     I have reviewed and updated as appropriate the past medical, family and social history. 07/01/2018 as summarized below:  Past Medical History:   Diagnosis Date   . Back pain, chronic    . Duodenal ulcer     hx of: acute, hemorrhage   . GERD (gastroesophageal reflux disease) 01/28/2018   . History of short term memory loss    . Mitral valve prolapse    . 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 Obstructive sleep apnea 01/28/2018   . Palpitations 01/28/2018     Past Surgical History:   Procedure Laterality Date   . Colonoscopy  2009   . Hx hernia repair     . Hx vasectomy     . Laminectomy       Current Outpatient Medications   Medication Sig   . atorvastatin (LIPITOR) 40 mg Oral Tablet Take 1 Tab (40 mg total) by mouth Every evening for 90 days   . celecoxib (CELEBREX) 200 mg Oral Capsule Take 1 Cap (200 mg total) by  mouth Once a day   . donepezil (ARICEPT) 5 mg Oral Tablet Take 10 mg by mouth Every night   . pantoprazole (PROTONIX) 40 mg Oral Tablet, Delayed Release (E.C.) TAKE 1 TABLET BY MOUTH TWICE A DAY (Patient taking differently: TAKE 1 TABLET BY MOUTH ONCE A DAY)   . venlafaxine (EFFEXOR XR) 150 mg Oral Capsule, Sust. Release 24 hr Take 150 mg by mouth Once a day     Family Medical History:     Problem Relation (Age of Onset)    Aortic Aneurysm Maternal Grandfather    No Known Problems Mother, Father            Social History     Socioeconomic History   . Marital status: Married     Spouse name: Not on file   . Number of children: Not on file   . Years of education: Not on file   . Highest education level: Not on file   Tobacco Use   . Smoking status: Former Smoker     Packs/day: 1.00     Years: 8.00     Pack years: 8.00     Types: Cigarettes   . Smokeless tobacco: Never Used   Substance and Sexual Activity   . Alcohol use: Yes     Comment: 1-2 glasse wine on  dinner daily, beer once every while    . Drug use: Never     Review of Systems:   Review of Systems   Constitutional: Negative for chills, diaphoresis and fever.        Here with his wife   HENT: Negative for ear pain and nosebleeds.    Eyes: Negative for double vision and discharge.   Respiratory: Negative for hemoptysis.    Cardiovascular: Negative for chest pain and palpitations.   Gastrointestinal: Negative for blood in stool and melena.   Genitourinary: Negative for hematuria.   Musculoskeletal: Negative for myalgias.   Skin: Negative for rash.   Neurological: Negative for tremors, speech change and focal weakness.   Endo/Heme/Allergies: Does not bruise/bleed easily.   Psychiatric/Behavioral: Negative for hallucinations and memory loss.   All other systems reviewed and are negative.       List of Current Health Care Providers   Care Team     PCP     Name Type Specialty Phone Number    Lucia Bitter, MD Physician [facility] [phone]          Care Team      No care team found                  Health Maintenance   Topic Date Due   . Hepatitis C screening antibody  09/21/1988   . Fecal Occult Blood/FIT Test: High Sensitivity  09/21/1993   . AAA Screening  09/21/2008   . Shingles Vaccine (1 of 2) 12/07/2017   . Influenza Vaccine (1) 04/14/2018   . Depression Screening  01/30/2019   . Adult Tdap-Td (2 - Td) 01/27/2025   . Pneumococcal 65+ Years Low Risk  Completed     Medicare Wellness Assessment   Medicare initial or wellness physical in the last year?: No  Advance Directives (optional)   Does patient have a living will or MPOA: no   Has patient provided [employer] with a copy?: no   Advance directive information given to the patient today?: no      Activities of Daily Living   Do you need help with dressing, bathing, or walking?: No   Do you need help with shopping, housekeeping, medications, or finances?: No   Do you have rugs in hallways, broken steps, or poor lighting?: No   Do you have grab bars in your bathroom, non-slip strips in your tub, and hand rails on your stairs?: Yes   Urinary Incontinence Screen (Women >=65 only)       Cognitive Function Screen (1=Yes, 0=No)   What is you age?: Correct   What is the time to the nearest hour?: Correct   What is the year?: Correct   What is the name of this clinic?: Correct   Can the patient recognize two persons (the doctor, the nurse, home help, etc.)?: Correct   What is the date of your birth? (day and month sufficient) : Correct   In what year did World War II end?: Correct   Who is the current president of the [place]?: Correct   Count from 20 down to 1?: Correct   What address did I give you earlier?: Incorrect   Total Score: 9   Hearing Screen   Have you noticed any hearing difficulties?: No  After whispering 9-1-6 how many numbers did the patient repeat correctly?: 3   Fall Risk Screen   Do you feel unsteady when standing or walking?: No  Do you  worry about falling?: No  Have you fallen in the past  year?: No   Vision Screen   Right Eye = 20: 30(with correction)   Left Eye = 20: 30(with correction)   Depression Screen     Little interest or pleasure in doing things.: Not at all  Feeling down, depressed, or hopeless: Not at all  PHQ 2 Total: 0        OBJECTIVE:   BP 130/70 (Site: Left, Patient Position: Sitting, Cuff Size: Adult)   Pulse 100   Temp 36.4 C (97.5 F) (Thermal Scan)   Ht 1.87 m (6' 1.62")   Wt 74.4 kg (164 lb 0.4 oz)   SpO2 95% Comment: room air  BMI 21.28 kg/m        Other appropriate exam:  Physical Exam   Constitutional: He is oriented to person, place, and time and well-developed, well-nourished, and in no distress.   HENT:   Head: Normocephalic and atraumatic.   Right Ear: External ear normal.   Left Ear: External ear normal.   Mouth/Throat: Oropharynx is clear and moist.   Eyes: Pupils are equal, round, and reactive to light. No scleral icterus.   Neck: Neck supple. No JVD present. No tracheal deviation present. No thyromegaly present.   Cardiovascular: Normal rate, regular rhythm and normal heart sounds.   No murmur heard.  Pulmonary/Chest: Breath sounds normal. He has no rales.   Abdominal: Soft. Bowel sounds are normal. He exhibits no mass.   Musculoskeletal:         General: No deformity or edema.   Neurological: He is alert and oriented to person, place, and time.   Skin: Skin is warm and dry.   Psychiatric: Affect normal.   Nursing note and vitals reviewed.      Health Maintenance Due   Topic Date Due   . Hepatitis C screening antibody  09/21/1988   . Fecal Occult Blood/FIT Test: High Sensitivity  09/21/1993   . AAA Screening  09/21/2008   . Shingles Vaccine (1 of 2) 12/07/2017   . Influenza Vaccine (1) 04/14/2018      ASSESSMENT & PLAN:   1. Encounter for Medicare annual wellness exam     Identified Risk Factors/ Recommended Actions             Orders Placed This Encounter   . Influenza Vaccine IM 0.70ml Age 72 Months through Adult(Admin)   . POCT URINE DIPSTICK   . atorvastatin  (LIPITOR) 40 mg Oral Tablet          Urine Dip Results:   Time collected: 1348  Glucose: Negative  Bilirubin: Negative  Ketones: Negative  Urine Specific Gravity: 1.015  pH: 6.5  Protein: Negative  Urobilinogen: 0.2mg /dL (Normal)  Nitrite: Negative  Leukocytes: Negative     POCT UA NORMAL       Rapid Flu         I have reviewed and interpreted  the point of care( POCT) results in this note and used this information in my assessment. Interperation: UA normal  Lucia Bitter, MD  07/01/2018, 14:35          The patient has been evaluated today. Screening has been done and abnormalities discussed. All immunizations have been brought up to date unless pt refused or has a medical contraindication. The patient's questions have been answered and follow-up has been arranged. Medicare wellness parameters have been evaluated and addressed if applicable. Routine follow-up has been arranged for chronic medical conditions and  any new acute medical concerns addressed today.  The patient has been educated about risk factors and recommended preventive care. Written Prevention Plan completed/ updated and given to patient (see After Visit Summary).  1. Encounter for Medicare annual wellness exam  Discussed importance of changing eating habits including portion control and food choices emphasizing nutricious foods and avoiding sugary and processed foods that have too many calories and little nutrition.    - POCT URINE DIPSTICK    2. Need for influenza vaccination  Vaccine counselling for all components completed.   - Influenza Vaccine IM 0.9ml Age 74 Months through Adult(Admin)    3. Mixed hypercholesterolemia and hypertriglyceridemia  ASSESSMENT  OF PROBLEM  HYPERLIPIDEMIA  ASSESSMENT  OF PROBLEM  Controlled  Improving    CMP  Lab Results   Component Value Date/Time    CALCIUM 9.5 06/23/2017    PHOSPHORUS 3.2 01/04/2016 03:40 AM    ALBUMIN 2.5 (L) 01/01/2016 11:34 AM    TOTALPROTEIN 4.0 (L) 12/31/2015 09:30 AM    ALKPHOS 50 06/23/2017      AST 30 06/23/2017    ALT 21 06/23/2017    TOTBILIRUBIN 4.2 06/23/2017     LIPIDS  Lab Results   Component Value Date    CHOLESTEROL 166 06/28/2018    HDLCHOL 64 06/28/2018    LDLCHOL 94 06/28/2018    TRIG 42 06/28/2018      Continue low fat, low carb diet.  Call if you develop any muscle aches.  Continue your medication for Hyperlipidemia.  Get Lipid panel every 6-12 months.      Return in about 1 year (around 07/02/2019) for medicare wellness.    Lucia Bitter, MD  [facility]  FAMILY MEDICINE, [facility]  [address] [place] [address]  [phone]

## 2018-07-01 NOTE — Nursing Note (Signed)
Immunization administered     Name Date Dose VIS Date Route    INFLUENZA VACCINE IM 07/01/2018 0.5 mL 03/28/2018 Intramuscular    Site: Left deltoid    Given By: Loraine MapleWinter, Diana, MA    Manufacturer: [employer]    Lot: [id number]    NDC: [id number]        Loraine Mapleiana Winter, MA  07/01/2018, 14:04

## 2018-07-01 NOTE — Nursing Note (Signed)
07/01/18 1300   Required: Location Test Performed At:   [facility] [place], [address], [place], [place] [id number]   Urine test  (Siemens Multistix 10 SG)   Time collected 1348   Color Yellow   Clarity Clear   Glucose Negative   Bilirubin Negative   Ketones Negative   Urine Specific Gravity 1.015   pH 6.5   Protein Negative   Urobilinogen 0.2mg /dL (Normal)   Nitrite Negative   Leukocytes Negative   Lot # [id number]   Expiration Date 03/14/19   Initials dlw   [place] Winter, MA  07/01/2018, 14:05

## 2018-07-01 NOTE — Patient Instructions (Signed)
Medicare Preventive Services  Medicare coverage information Recommendation for YOU   Heart Disease and Diabetes   Lipid profile every 5 years or more often if at risk for cardiovascular disease  Last Lipid Panel  (Last result in the past 2 years)      Cholesterol   HDL   LDL   Direct LDL   Triglycerides      06/28/18 [phone] 94   42         Diabetes Screening with Blood Glucose test or Glucose Tolerance Test  yearly for those at risk for diabetes, up to two tests per year for those with prediabetes  Last Glucose: 89     Diabetes Self-Management Training initial training ten hours per year, and follow-up training two hours per subsequent year. Optional for those with diabetes    Medical Nutrition Therapy three hours of one-on-one counseling in first year, two hours in subsequent years. Optional for those with diabetes, kidney disease   Intensive Behavioral Therapy for Obesity  Face-to-face counseling, first month every week, month 2-6 every other week, month 7-12 every month if continued progress is documented Optional for those with Body Mass Index 30 or higher  Your Body mass index is 21.28 kg/m.   Tobacco Cessation (Quitting) Counseling   Two attempts per year, max 4 sessions per attempt, up to 8 sessions per year Optional for those who use tobacco    Cancer Screening   Colorectal screening   for anyone age 74 to 5175 or any age if high risk:  . Screening Colonoscopy every 10 years, more frequent if high risk  OR  . Flexible  Sigmoidoscopy  every 5 years OR  . Fecal Occult Blood Testing yearly OR  . Cologuard Stool DNA test once every 3 years OR  . CT Colonography every 5 years  See Your Schedule below   Prostate Cancer Screening  Prostate Specific Antigen and Digital Rectal Exam NOT routinely recommended  Medicare will still cover annually after age 150 if recommended by your provider      Lung Cancer Screening  Annual low dose computed tomography (LDCT scan) is recommended for those age 74-77 who smoked 30  pack-years and are current smokers or quit smoking within past 15 years (one pack-year= smoking one PPD for one year), after counseling by your doctor or nurse clinician about the possible benefits or harms. See Your Schedule below   Vaccinations   Pneumococcal Vaccine recommended routinely age 69+ with two separate vaccines one year apart (Prevnar then Pneumovax).  Recommended before age 74 if medical conditions increase risk  Seasonal Influenza Vaccine once every flu season   Hepatitis B Vaccine 3 doses if risk (including anyone with diabetes or liver disease)  Shingles Vaccine Once or twice age 74 or older  Diphtheria Tetanus Pertussis Vaccine ONCE as adult and a booster every 10 years      Immunization History   Administered Date(s) Administered   . Influenza Vaccine IM (ADMIN) 05/25/2017, 07/01/2018   . Pneumovax 06/21/2017   . Prevnar 13 (Admin) 06/06/2016   . Shingrix - Zoster Vaccine (Admin) 07/14/2017, 10/12/2017     Shingles vaccine and Diphtheria Tetanus Pertussis vaccines are available at pharmacies or local health department without a prescription.   Other Screening   Glaucoma Screening   yearly if in high risk group such as diabetes, family history, African American age 10850+ or Hispanic American age 69+    Hepatitis C Screening recommended ONCE for those born between  [phone], or high risk for HCV infection  See Your Schedule below   HIV Testing recommended routinely at least ONCE, covered every year for age 26 to 46 regardless of risk, and every year for age over 22 who ask for the test or higher risk See Your Schedule below  Abdominal Aortic Aneurysm Screening Ultrasound Recommended ONCE for any male who smoked 100 cigarettes/lifetime OR with family history of aortic aneurysm     Your Personalized Schedule for Preventive Tests   Health Maintenance: Pending and Last Completed       Date Due Completion Date    Hepatitis C screening antibody 09/21/1988 ---    Fecal Occult Blood/FIT Test: High  Sensitivity 09/21/1993 ---    AAA Screening 09/21/2008 ---    Shingles Vaccine (1 of 2) 12/07/2017 ---    Influenza Vaccine (1) 04/14/2018 05/25/2017    Depression Screening 01/30/2019 01/29/2018    Adult Tdap-Td (2 - Td) 01/27/2025 01/28/2015

## 2018-08-16 ENCOUNTER — Other Ambulatory Visit (INDEPENDENT_AMBULATORY_CARE_PROVIDER_SITE_OTHER): Payer: Self-pay | Admitting: Family Medicine

## 2018-09-11 ENCOUNTER — Other Ambulatory Visit (INDEPENDENT_AMBULATORY_CARE_PROVIDER_SITE_OTHER): Payer: Self-pay | Admitting: Family Medicine

## 2018-09-11 MED ORDER — ATORVASTATIN 40 MG TABLET: 40 mg | Tab | Freq: Every evening | ORAL | 3 refills | 0 days | Status: DC

## 2018-09-13 ENCOUNTER — Other Ambulatory Visit (INDEPENDENT_AMBULATORY_CARE_PROVIDER_SITE_OTHER): Payer: Self-pay | Admitting: Family Medicine

## 2018-09-13 MED ORDER — ATORVASTATIN 40 MG TABLET
40.0000 mg | ORAL_TABLET | Freq: Every evening | ORAL | 3 refills | Status: DC
Start: 2018-09-13 — End: 2019-07-04

## 2018-09-13 NOTE — Nursing Note (Signed)
Faxed yesterday  Loraine Maple, MA  09/13/2018, 08:11

## 2018-09-20 ENCOUNTER — Encounter (INDEPENDENT_AMBULATORY_CARE_PROVIDER_SITE_OTHER): Payer: Self-pay | Admitting: Family Medicine

## 2018-11-08 ENCOUNTER — Other Ambulatory Visit (INDEPENDENT_AMBULATORY_CARE_PROVIDER_SITE_OTHER): Payer: Self-pay | Admitting: Family Medicine

## 2018-11-09 ENCOUNTER — Other Ambulatory Visit (INDEPENDENT_AMBULATORY_CARE_PROVIDER_SITE_OTHER): Payer: Self-pay | Admitting: Family Medicine

## 2018-11-22 ENCOUNTER — Other Ambulatory Visit (INDEPENDENT_AMBULATORY_CARE_PROVIDER_SITE_OTHER): Payer: Self-pay | Admitting: Family Medicine

## 2018-11-22 MED ORDER — FAMOTIDINE 40 MG TABLET
40.00 mg | ORAL_TABLET | Freq: Two times a day (BID) | ORAL | 1 refills | Status: DC
Start: 2018-11-22 — End: 2019-02-17

## 2018-11-22 NOTE — Nursing Note (Signed)
Pantoprazole not covered.  Loraine Maple, MA  11/22/2018, 14:01

## 2018-11-25 NOTE — Addendum Note (Signed)
Addended by: Loraine Maple on: 11/25/2018 08:07 AM     Modules accepted: Orders

## 2019-01-29 ENCOUNTER — Ambulatory Visit (INDEPENDENT_AMBULATORY_CARE_PROVIDER_SITE_OTHER): Payer: Self-pay | Admitting: Family Medicine

## 2019-01-29 NOTE — Telephone Encounter (Signed)
CALLED AND DISCUSSED WITH WIFE. DISCUSSED RISK OF GI BLEED ON ELIQUIS BUT ALSO THE RISK OF STROKE WITHOUT IT. ON FAMOTIDINE AND NEED FOR CLOSE MONITORING. WILL NEED CARDIAC FOLLOW UP AND DISCUSSED REFERRAL TO DR [place] IN THE FUTURE

## 2019-01-29 NOTE — Telephone Encounter (Signed)
Regarding: AFIB / MED CONSULT   ----- Message from Sandria Senter, [facility] sent at 01/29/2019 12:32 PM EDT -----  Claudette Head, MD    [place] wife called stating that the pt went to the hospital, because the pt was in Afib, the hospital was recommending Eliquis, but the pt wife is concerned since the pt has had bleeding ulcer. Lyndee Leo is requesting for Dr. Wynetta Emery to call her sometime so she can discuss this.     Thank you

## 2019-02-16 ENCOUNTER — Other Ambulatory Visit (INDEPENDENT_AMBULATORY_CARE_PROVIDER_SITE_OTHER): Payer: Self-pay | Admitting: Family Medicine

## 2019-02-25 ENCOUNTER — Ambulatory Visit (INDEPENDENT_AMBULATORY_CARE_PROVIDER_SITE_OTHER): Payer: Self-pay | Admitting: Family Medicine

## 2019-02-25 LAB — COMPREHENSIVE METABOLIC PANEL
ALBUMIN: 4.1 g/dL
ALKALINE PHOSPHATASE: 42 IU/L — ABNORMAL LOW
ALT (SGPT): 32 IU/L
AST (SGOT): 44 IU/L — ABNORMAL HIGH
BILIRUBIN, TOTAL: 0.5 mg/dL
BUN: 23 mg/dL — ABNORMAL HIGH
CALCIUM: 9.5 mg/dL
CARBON DIOXIDE, TOTAL: 30 mmol/L
CHLORIDE: 91 mmol/L — ABNORMAL LOW
CREATININE: 0.9 mg/dL
EGFR IF NONAFRICN AM: 82 mL/min/{1.73_m2}
GLUCOSE: 140 mg/dL — ABNORMAL HIGH
POTASSIUM: 404 mmol/L
PROTEIN, TOTAL: 7.5 g/dL
SODIUM: 129 mmol/L — ABNORMAL LOW

## 2019-02-25 LAB — CBC/DIFF
HCT: 41.5
HGB: 14.2
MCH: 32
MCHC: 34
MCV: 93
MPV: 10.7
PLATELET COUNT: 222
RBC: 4.45
RDW: 12.6
WBC: 4.38 — ABNORMAL LOW

## 2019-02-25 LAB — TSH+FREE T4
THYROXINE (T4), FREE: 1.19 ng/dL
TSH: 2.56

## 2019-02-25 NOTE — Telephone Encounter (Signed)
Patient had labs in system which are the same Labs Dr. Jossie Ng, listed in his instructions to patient he was going to order. Orders reprinted and are at the front desk for Patient's wife to pick up. Wife Lyndee Leo made aware.  Samuel Bouche  02/25/2019, 10:10

## 2019-02-25 NOTE — Telephone Encounter (Signed)
Regarding: consult  ----- Message from [place] sent at 02/25/2019  9:19 AM EDT -----  Claudette Head, MD    Pt's wife, Lyndee Leo is calling.  She states that pt saw a cardiologist, Dr. Jossie Ng (?) on Friday.  The doctor informed pt that he is going to fax or put into the system an order for blood work.  Pt's wife wasn't sure exactly what was to be checked.  Did you receive the fax or anything from Dr. Jossie Ng. If not, is Dr.Johnson able to write the order? Pt's wife is wanting to pick up the slip to take to [facility]  Please call pt's wife, Lyndee Leo to discuss. [phone]      Thank you

## 2019-02-26 ENCOUNTER — Other Ambulatory Visit (INDEPENDENT_AMBULATORY_CARE_PROVIDER_SITE_OTHER): Payer: Self-pay | Admitting: Family Medicine

## 2019-02-26 ENCOUNTER — Ambulatory Visit (INDEPENDENT_AMBULATORY_CARE_PROVIDER_SITE_OTHER): Payer: Self-pay | Admitting: Family Medicine

## 2019-02-26 NOTE — Telephone Encounter (Signed)
Regarding: results   ----- Message from Luciana Axe sent at 02/26/2019  1:47 PM EDT -----  Claudette Head, MD      Patient had labs done at [facility] 02-26-2019, Patients wife is requesting a phone call back when the results are in.

## 2019-02-26 NOTE — Telephone Encounter (Signed)
Wife asking for Lab results. Attached.    [name]  02/26/2019, 15:46  Media Information            Document Information     LAB RESULTS   02/25/19 TSH T4   02/25/2019 00:00   Attached To:   Tsh+Free T4 [[id number]]   Orders Only on 02/26/19 with Claudette Head, [place] [place] [facility]     Media Information            Document Information     LAB RESULTS   02/25/19 COMP   02/25/2019 00:00   Attached To:   Comprehensive Metabolic Panel [[id number]]   Orders Only on 02/26/19 with Claudette Head, [place] [place] [facility]     Media Information            Document Information     LAB RESULTS   02/25/19 CBC   02/25/2019 00:00   Attached To:   Cbc/Diff [[id number]]   Orders Only on 02/26/19 with Claudette Head, [place]   Source [place], [place]  02/26/2019, 15:47

## 2019-02-27 ENCOUNTER — Ambulatory Visit (INDEPENDENT_AMBULATORY_CARE_PROVIDER_SITE_OTHER): Payer: Self-pay | Admitting: Family Medicine

## 2019-02-27 NOTE — Telephone Encounter (Signed)
Regarding: consult   ----- Message from Luciana Axe sent at 02/27/2019  3:03 PM EDT -----  Claudette Head, MD      Patient's wife states she was calling in for patients results please call to advise.

## 2019-02-27 NOTE — Telephone Encounter (Signed)
Labs sent to Dr. Wynetta Emery in another encounter.   Awaiting response.  [name]  02/27/2019, 15:15

## 2019-02-28 ENCOUNTER — Telehealth (INDEPENDENT_AMBULATORY_CARE_PROVIDER_SITE_OTHER): Payer: Self-pay | Admitting: Family Medicine

## 2019-02-28 NOTE — Telephone Encounter (Signed)
Called patient prior to appointment to prescreen for COVID-19.      Have you had new or worsened shortness of breath in the past 14 days?  No  Have you had a new or worsening cough in the past 14 days?  No  Have you had a fever in the past 14 days?  No  Have you experienced a loss of taste or smell in the past 14 days? No  Have you experienced headache with nausea in the past 14 days? No    Patient or patients guardian/attendant has a Negative Prescreen.  Instructed patient to proceed with appointment as planned.    Patient informed of visitor policy at this time.    Informed patient that we are requesting all patients and visitors wear a mask when entering the clinic.     Appointment notes have been updated to reflect screening.    Patient instructed to present to the clinic for scheduled appointment    [name]  02/28/2019, 08:34

## 2019-02-28 NOTE — Telephone Encounter (Signed)
Called and discussed low sodium, has appointment on 7/20 and will review then

## 2019-03-03 ENCOUNTER — Other Ambulatory Visit: Payer: Self-pay

## 2019-03-03 ENCOUNTER — Ambulatory Visit (INDEPENDENT_AMBULATORY_CARE_PROVIDER_SITE_OTHER): Payer: BLUE CROSS/BLUE SHIELD | Admitting: Family Medicine

## 2019-03-03 ENCOUNTER — Encounter (INDEPENDENT_AMBULATORY_CARE_PROVIDER_SITE_OTHER): Payer: Self-pay | Admitting: Family Medicine

## 2019-03-03 VITALS — BP 110/78 | HR 77 | Temp 97.3°F | Ht 73.62 in | Wt 151.9 lb

## 2019-03-03 DIAGNOSIS — E871 Hypo-osmolality and hyponatremia: Secondary | ICD-10-CM

## 2019-03-03 DIAGNOSIS — K4090 Unilateral inguinal hernia, without obstruction or gangrene, not specified as recurrent: Secondary | ICD-10-CM

## 2019-03-03 DIAGNOSIS — N50812 Left testicular pain: Secondary | ICD-10-CM | POA: Insufficient documentation

## 2019-03-03 DIAGNOSIS — I480 Paroxysmal atrial fibrillation: Secondary | ICD-10-CM

## 2019-03-03 NOTE — Progress Notes (Signed)
OUTPATIENT PROGRESS NOTE    Subjective:   Patient ID:  [name] is a pleasant 73 y.o. male.    Chief Complaint: Atrial Fibrillation (In hospital in June in [place]. ); [facility] ([facility] and Groin Pain (Seen by urologist recently.  ? hernia left side)      History of Present Illness:  Paroxysmal atrial fibrillation (CMS HCC)  REVIEWED RECORDS, NOW IN NSR BUT AMIODARONE WAS RECENT STOPPED DUE TO "JITTERINESS" He IS NOTING IMPROVEMENT AND THRYROID STUDIES NORMAL  Testicular pain, left  BEING FOLLOWED BY LONG TIME UROLOGIST, HAS HAD A SERIES OF 3 ANTIBIOTICS AND NOW FEELS SOME TENDERNESS OF THE TESTICLE BUT THIS IS MINIMAL.     Hyponatremia  ON RECENT LAB THE SODIUM IS 129.  WILL NEEED FOLLOW UP    Unilateral inguinal hernia     ASYMPTOMATIC LEFT INQUINAL HERNIA      The history is provided by the patient.      Allergies:   No Known Allergies      Medications:   apixaban (ELIQUIS) 5 mg Oral Tablet, Take by mouth Twice daily  atorvastatin (LIPITOR) 40 mg Oral Tablet, Take 1 Tab (40 mg total) by mouth Every evening for 90 days  donepezil (ARICEPT) 5 mg Oral Tablet, Take 10 mg by mouth Every night  venlafaxine (EFFEXOR XR) 150 mg Oral Capsule, Sust. Release 24 hr, Take 150 mg by mouth Once a day  celecoxib (CELEBREX) 200 mg Oral Capsule, TAKE 1 CAPSULE BY MOUTH EVERY DAY  famotidine (PEPCID) 40 mg Oral Tablet, TAKE 1 TABLET BY MOUTH TWICE A DAY    No facility-administered medications prior to visit.         Immunization History:     Immunization History   Administered Date(s) Administered   . Influenza Vaccine IM (ADMIN) 05/25/2017, 07/01/2018   . Pneumovax 06/21/2017   . Prevnar 13 (Admin) 06/06/2016   . Shingrix - Zoster Vaccine (Admin) 07/14/2017, 10/12/2017         Past Medical History:     Past Medical History:   Diagnosis Date   . Atrial fibrillation (CMS HCC)    . Back pain, chronic    . Duodenal ulcer     hx of: acute, hemorrhage   . GERD (gastroesophageal reflux disease) 01/28/2018   . History  of short term memory loss    . Mitral valve prolapse    . 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 Obstructive sleep apnea 01/28/2018   . Palpitations 01/28/2018             Past Surgical History:     Past Surgical History:   Procedure Laterality Date   . COLONOSCOPY  2009   . HX HERNIA REPAIR     . HX VASECTOMY     . LAMINECTOMY               Family History:     Family Medical History:     Problem Relation (Age of Onset)    Aortic Aneurysm Maternal Grandfather    No Known Problems Mother, Father                Social History:   Henry LiBruce Barber  reports that he has quit smoking. His smoking use included cigarettes. He has a 8.00 pack-year smoking history. He has never used smokeless tobacco. He reports current alcohol use. He reports that he does not use drugs.       Review of Systems:  In addition to HPI  Review of Systems   Constitutional: Negative for chills, diaphoresis and fever.   HENT: Negative for ear pain and nosebleeds.    Eyes: Negative for double vision and discharge.   Respiratory: Negative for hemoptysis.    Cardiovascular: Negative for chest pain and palpitations.   Gastrointestinal: Negative for blood in stool and melena.   Genitourinary: Negative for hematuria.   Musculoskeletal: Negative for myalgias.   Skin: Negative for rash.   Neurological: Negative for tremors, speech change and focal weakness.   Endo/Heme/Allergies: Does not bruise/bleed easily.   Psychiatric/Behavioral: Negative for hallucinations and memory loss.   All other systems reviewed and are negative.        Objective:   Vitals:    Vitals:    03/03/19 1135   BP: 110/78   Pulse: 77   Temp: 36.3 C (97.3 F)   TempSrc: Thermal Scan   SpO2: 95%   Weight: 68.9 kg (151 lb 14.4 oz)   Height: 1.87 m (6' 1.62")   BMI: 19.74         Wt Readings from Last 3 Encounters:   03/03/19 68.9 kg (151 lb 14.4 oz)   07/01/18 74.4 kg (164 lb 0.4 oz)   05/01/18 73.9 kg (163 lb)      Body mass index is 19.7 kg/m.  Physical Exam      Constitutional: He is oriented to person, place, and time and well-developed, well-nourished, and in no distress.   HENT:   Head: Normocephalic and atraumatic.   Right Ear: External ear normal.   Left Ear: External ear normal.   Mouth/Throat: Oropharynx is clear and moist.   Eyes: Pupils are equal, round, and reactive to light. No scleral icterus.   Neck: Neck supple. No JVD present. No tracheal deviation present. No thyromegaly present.   Cardiovascular: Normal rate, regular rhythm and normal heart sounds.   No murmur heard.  Pulmonary/Chest: Breath sounds normal. He has no rales.   Abdominal: Soft. Bowel sounds are normal. He exhibits no mass.   Left direct inquinal hernia  Easily reducable   Genitourinary:    Prostate and penis normal.      Genitourinary Comments: Left testicle with minimal tenderness, right normal     Musculoskeletal:         General: No deformity or edema.   Neurological: He is alert and oriented to person, place, and time.   Skin: Skin is warm and dry.   Psychiatric: Affect normal.   Nursing note and vitals reviewed.      POCT Results:                   I have reviewed and interpreted  the point of care( POCT) results in this note and used this information in my assessment. Interperation: None performed Today  Henry Bitterhomas Sherrick Araki, MD  03/03/2019, 12:11      There are no exam notes on file for this visit.    Assessment & Plan:   1. Paroxysmal atrial fibrillation (CMS HCC)  CONTINUE TO MONITOR CLOSELY OFF AMIODARONE.     2. Testicular pain, left  MONITOR FOR NOW, BACT TO UROLOGY IF SX WORSEN    3. Hyponatremia  REPEAT LAB IN 1 O  - BASIC METABOLIC PANEL; Future  - BASIC METABOLIC PANEL    4. Unilateral inguinal hernia  MONITOR FOR NOW. DISCUSSED POTENTIAL COMPLICATIONS    Covid-19 Instructions and Precautions  Stay home and continue to isolate  Call the  office if you are sick to get instructions.  Practice social distancing  Avoid touching eyes, nose, and mouth  Clean and disinfect surfaces  Wash  your hands often with soap and water for 20 seconds  Call if any fever, increased SOB, cough or body aches                Health Maintenance   Topic Date Due   . Hepatitis C screening  09/21/1988   . Fecal Occult Blood/FIT Test: High Sensitivity  09/21/1993   . AAA Screening  09/21/2008   . Influenza Vaccine (1) 04/15/2019   . Depression Screening  07/02/2019   . Adult Tdap-Td (2 - Td) 01/27/2025   . Pneumococcal 65+ Years Low Risk  Completed   . Shingles Vaccine  Completed     Return if symptoms worsen or fail to improve.  Return in about 1 month (around 04/03/2019) for Follow up.  Claudette Head, MD 03/03/2019 12:11

## 2019-03-25 LAB — BASIC METABOLIC PANEL
BUN: 21 — ABNORMAL HIGH
CALCIUM: 9.3
CARBON DIOXIDE: 31
CHLORIDE: 100
CREATININE: 0.9
ESTIMATED GLOMERULAR FILTRATION RATE: 82
GLUCOSE,NONFAST: 84
POTASSIUM: 4.4
SODIUM: 135

## 2019-04-02 ENCOUNTER — Telehealth (INDEPENDENT_AMBULATORY_CARE_PROVIDER_SITE_OTHER): Payer: Self-pay | Admitting: Family Medicine

## 2019-04-02 NOTE — Telephone Encounter (Signed)
Attempted to contact patient to complete COVID-19 prescreen prior to scheduled appointment. Unable to reach patient at this time, left message for patient to call back in and complete prescreening prior to appointment. Call back number provided [facility] [phone].    [name]  04/02/2019, 15:53

## 2019-04-03 ENCOUNTER — Ambulatory Visit (INDEPENDENT_AMBULATORY_CARE_PROVIDER_SITE_OTHER): Payer: Self-pay | Admitting: Family Medicine

## 2019-04-03 NOTE — Telephone Encounter (Signed)
Regarding: return call   ----- Message from Sandria Senter, [facility] sent at 04/03/2019  1:54 PM EDT -----  Claudette Head, MD     Malachy Mood  Pt wife called back to speak with you regarding the pt lab results, please call. Thank you

## 2019-04-03 NOTE — Telephone Encounter (Signed)
Patient was given his Lab results: He is planning to keep his appointment on Monday.   [name]  04/03/2019, 15:36

## 2019-04-03 NOTE — Telephone Encounter (Signed)
Attempted to call patient, no answer at this time. Left message for patient to call back regarding : Patient's Labs are Normal. If he has any questions, please call back.  Call back number provided [facility] [phone].   Samuel Bouche  04/03/2019, 13:52

## 2019-04-03 NOTE — Telephone Encounter (Signed)
Regarding: Consult   ----- Message from Luciana Axe sent at 04/03/2019 12:16 PM EDT -----  Claudette Head, MD      Patient's wife states she would like to know that we have patients blood work Metabolic Pannel done on  [phone],     she states that is the reason for his appointment, Please call to advise.

## 2019-04-04 ENCOUNTER — Encounter (INDEPENDENT_AMBULATORY_CARE_PROVIDER_SITE_OTHER): Payer: Self-pay | Admitting: Family Medicine

## 2019-04-04 ENCOUNTER — Telehealth (INDEPENDENT_AMBULATORY_CARE_PROVIDER_SITE_OTHER): Payer: Self-pay | Admitting: Family Medicine

## 2019-04-04 NOTE — Telephone Encounter (Signed)
Attempted to contact patient to complete COVID-19 prescreen prior to scheduled appointment. Unable to reach patient at this time, left message for patient to call back in and complete prescreening prior to appointment. Call back number provided [facility] [phone].    [name]  04/04/2019, 09:00

## 2019-04-07 ENCOUNTER — Other Ambulatory Visit: Payer: Self-pay

## 2019-04-07 ENCOUNTER — Ambulatory Visit (INDEPENDENT_AMBULATORY_CARE_PROVIDER_SITE_OTHER): Payer: BLUE CROSS/BLUE SHIELD | Admitting: Family Medicine

## 2019-04-07 ENCOUNTER — Encounter (INDEPENDENT_AMBULATORY_CARE_PROVIDER_SITE_OTHER): Payer: Self-pay | Admitting: Family Medicine

## 2019-04-07 VITALS — BP 110/68 | HR 72 | Temp 97.7°F | Ht 73.62 in | Wt 152.1 lb

## 2019-04-07 DIAGNOSIS — R413 Other amnesia: Secondary | ICD-10-CM

## 2019-04-07 DIAGNOSIS — G309 Alzheimer's disease, unspecified: Secondary | ICD-10-CM | POA: Insufficient documentation

## 2019-04-07 DIAGNOSIS — I480 Paroxysmal atrial fibrillation: Secondary | ICD-10-CM

## 2019-04-07 DIAGNOSIS — F0280 Dementia in other diseases classified elsewhere without behavioral disturbance: Secondary | ICD-10-CM | POA: Insufficient documentation

## 2019-04-07 NOTE — Progress Notes (Signed)
OUTPATIENT PROGRESS NOTE    Subjective:   Patient ID:  [name] is a pleasant 73 y.o. male.    Chief Complaint: Atrial Fibrillation (Patient present with wife today for a follow up )      History of Present Illness:  Paroxysmal atrial fibrillation (CMS HCC)  PATIENT OFF AMIODARONE AND LAB HAS NORMALIZED. His CARDIOLOGIST HAS STARTED HIM ON METOPROLOL THAT He AND WIFE HAVE QUESTIONS ABOUT. DISCUSSED AT LENGTH AND I THINK THERE QUESTIONS ARE ANSWERED AND AGREE TO START NOW\    Memory change   TOLERATING ARICEPT        The history is provided by the patient.      Allergies:   No Known Allergies      Medications:   apixaban (ELIQUIS) 5 mg Oral Tablet, Take by mouth Twice daily  atorvastatin (LIPITOR) 40 mg Oral Tablet, Take 1 Tab (40 mg total) by mouth Every evening for 90 days  donepezil (ARICEPT) 5 mg Oral Tablet, Take 10 mg by mouth Every night  metoprolol succinate (TOPROL-XL) 25 mg Oral Tablet Sustained Release 24 hr, Take 25 mg by mouth Once a day  venlafaxine (EFFEXOR XR) 150 mg Oral Capsule, Sust. Release 24 hr, Take 150 mg by mouth Once a day    No facility-administered medications prior to visit.         Immunization History:     Immunization History   Administered Date(s) Administered   . Influenza Vaccine IM (ADMIN) 05/25/2017, 07/01/2018   . Pneumovax 06/21/2017   . Prevnar 13 (Admin) 06/06/2016   . Shingrix - Zoster Vaccine (Admin) 07/14/2017, 10/12/2017         Past Medical History:     Past Medical History:   Diagnosis Date   . Atrial fibrillation (CMS HCC)    . Back pain, chronic    . Duodenal ulcer     hx of: acute, hemorrhage   . GERD (gastroesophageal reflux disease) 01/28/2018   . History of short term memory loss    . Mitral valve prolapse    . 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 Obstructive sleep apnea 01/28/2018   . Palpitations 01/28/2018             Past Surgical History:     Past Surgical History:   Procedure Laterality Date   . COLONOSCOPY  2009   . HX HERNIA  REPAIR     . HX VASECTOMY     . LAMINECTOMY               Family History:     Family Medical History:     Problem Relation (Age of Onset)    Aortic Aneurysm Maternal Grandfather    No Known Problems Mother, Father                Social History:   Stephannie LiBruce Barber  reports that he has quit smoking. His smoking use included cigarettes. He has a 8.00 pack-year smoking history. He has never used smokeless tobacco. He reports current alcohol use. He reports that he does not use drugs.     Review of Systems: In addition to HPI  Review of Systems   Constitutional: Negative for chills, diaphoresis and fever.   HENT: Negative for ear pain and nosebleeds.    Eyes: Negative for double vision and discharge.   Respiratory: Negative for hemoptysis.    Cardiovascular: Negative for chest pain and palpitations.   Gastrointestinal: Negative for blood in stool and melena.  Genitourinary: Negative for hematuria.   Musculoskeletal: Negative for myalgias.   Skin: Negative for rash.   Neurological: Negative for tremors, speech change and focal weakness.   Endo/Heme/Allergies: Does not bruise/bleed easily.   Psychiatric/Behavioral: Negative for hallucinations and memory loss.   All other systems reviewed and are negative.        Objective:   Vitals:    Vitals:    04/07/19 1002   BP: 110/68   Pulse: 72   Temp: 36.5 C (97.7 F)   TempSrc: Thermal Scan   SpO2: 97%   Weight: 69 kg (152 lb 1.9 oz)   Height: 1.87 m (6' 1.62")   BMI: 19.77         Wt Readings from Last 3 Encounters:   04/07/19 69 kg (152 lb 1.9 oz)   03/03/19 68.9 kg (151 lb 14.4 oz)   07/01/18 74.4 kg (164 lb 0.4 oz)      Body mass index is 19.73 kg/m.  Physical Exam   Constitutional: He is oriented to person, place, and time and well-developed, well-nourished, and in no distress.   HENT:   Head: Normocephalic and atraumatic.   Right Ear: External ear normal.   Left Ear: External ear normal.   Mouth/Throat: Oropharynx is clear and moist.   Eyes: Pupils are equal, round, and  reactive to light. No scleral icterus.   Neck: Neck supple. No JVD present. No tracheal deviation present. No thyromegaly present.   Cardiovascular: Normal rate, regular rhythm and normal heart sounds.   No murmur heard.  Pulmonary/Chest: Breath sounds normal. He has no rales.   Abdominal: Soft. Bowel sounds are normal. He exhibits no mass.   Musculoskeletal:         General: No deformity or edema.   Neurological: He is alert and oriented to person, place, and time.   Skin: Skin is warm and dry.   Psychiatric: Affect normal.   Nursing note and vitals reviewed.      POCT Results:                   I have reviewed and interpreted  the point of care( POCT) results in this note and used this information in my assessment. Interperation: None performed Today  Claudette Head, MD  04/07/2019, 10:32      There are no exam notes on file for this visit.    Assessment & Plan:     1. Paroxysmal atrial fibrillation (CMS HCC)  CURRENTLY IN SINUS RHYTHM. WILL FOLLOW    2. Memory change  CONTINUE Aricept    FOLLOW UP DISCUSSED             Health Maintenance   Topic Date Due   . Hepatitis C screening  09/21/1988   . Fecal Occult Blood/FIT Test: High Sensitivity  09/21/1993   . AAA Screening  09/21/2008   . Influenza Vaccine (1) 04/15/2019   . Depression Screening  04/06/2020   . Adult Tdap-Td (2 - Td) 01/27/2025   . Pneumococcal 65+ Years Low Risk  Completed   . Shingles Vaccine  Completed     Return if symptoms worsen or fail to improve.  Return for Follow up.  Claudette Head, MD 04/07/2019 10:32

## 2019-06-20 ENCOUNTER — Telehealth (INDEPENDENT_AMBULATORY_CARE_PROVIDER_SITE_OTHER): Payer: Self-pay | Admitting: Family Medicine

## 2019-06-20 DIAGNOSIS — Z0000 Encounter for general adult medical examination without abnormal findings: Secondary | ICD-10-CM

## 2019-06-20 DIAGNOSIS — K909 Intestinal malabsorption, unspecified: Secondary | ICD-10-CM

## 2019-06-20 DIAGNOSIS — E782 Mixed hyperlipidemia: Secondary | ICD-10-CM

## 2019-06-20 DIAGNOSIS — I480 Paroxysmal atrial fibrillation: Secondary | ICD-10-CM

## 2019-06-20 DIAGNOSIS — N400 Enlarged prostate without lower urinary tract symptoms: Secondary | ICD-10-CM

## 2019-06-20 DIAGNOSIS — E876 Hypokalemia: Secondary | ICD-10-CM

## 2019-06-20 NOTE — Telephone Encounter (Signed)
Patient's spouse requested lab orders for the patient. Patient's spouse requested the orders be mailed to their home.  Orders were placed pending provider approval.   Oleh Genin, MA  06/20/2019, 10:18

## 2019-07-02 ENCOUNTER — Telehealth (INDEPENDENT_AMBULATORY_CARE_PROVIDER_SITE_OTHER): Payer: Self-pay | Admitting: Family Medicine

## 2019-07-02 NOTE — Telephone Encounter (Signed)
Called patient prior to appointment to prescreen for COVID-19.      Have you had new or worsened shortness of breath in the past 14 days?  No  Have you had a new or worsening cough in the past 14 days?  No  Have you had a fever in the past 14 days?  No  Have you experienced a loss of taste or smell in the past 14 days? No  Have you experienced headache with nausea in the past 14 days? No    Patient or patients guardian/attendant has a Negative Prescreen.      Patient informed of visitor policy at this time.    Informed patient that we are requesting all patients and visitors wear a mask when entering the clinic.     Appointment notes have been updated to reflect screening.    Patient instructed to present to the clinic for scheduled appointment.    [name]  07/02/2019, 15:28

## 2019-07-04 ENCOUNTER — Encounter (INDEPENDENT_AMBULATORY_CARE_PROVIDER_SITE_OTHER): Payer: Self-pay | Admitting: Family Medicine

## 2019-07-04 ENCOUNTER — Ambulatory Visit (INDEPENDENT_AMBULATORY_CARE_PROVIDER_SITE_OTHER): Payer: BLUE CROSS/BLUE SHIELD | Admitting: Family Medicine

## 2019-07-04 ENCOUNTER — Other Ambulatory Visit: Payer: Self-pay

## 2019-07-04 VITALS — BP 110/70 | HR 67 | Temp 96.4°F | Ht 74.02 in | Wt 154.8 lb

## 2019-07-04 DIAGNOSIS — E782 Mixed hyperlipidemia: Secondary | ICD-10-CM

## 2019-07-04 DIAGNOSIS — D6959 Other secondary thrombocytopenia: Secondary | ICD-10-CM

## 2019-07-04 DIAGNOSIS — F0280 Dementia in other diseases classified elsewhere without behavioral disturbance: Secondary | ICD-10-CM

## 2019-07-04 DIAGNOSIS — I480 Paroxysmal atrial fibrillation: Secondary | ICD-10-CM

## 2019-07-04 DIAGNOSIS — Z0000 Encounter for general adult medical examination without abnormal findings: Secondary | ICD-10-CM

## 2019-07-04 DIAGNOSIS — G309 Alzheimer's disease, unspecified: Secondary | ICD-10-CM

## 2019-07-04 MED ORDER — ATORVASTATIN 40 MG TABLET
40.00 mg | ORAL_TABLET | Freq: Every evening | ORAL | 3 refills | Status: DC
Start: 2019-07-04 — End: 2020-06-18

## 2019-07-04 MED ORDER — DONEPEZIL 10 MG TABLET
10.00 mg | ORAL_TABLET | Freq: Every evening | ORAL | 3 refills | Status: AC
Start: 2019-07-04 — End: 2019-10-02

## 2019-07-04 NOTE — Progress Notes (Signed)
OUTPATIENT PROGRESS NOTE    Subjective:   Patient ID:  [name] is a pleasant 73 y.o. male.    Chief Complaint: Physical (Yearly physical.  Wife is with patient today.  Labs not done yet.  Wants to wait on colonoscopy till spring.)  WIFE WITH HIM  DUE TO His ADVANCING MEMORY DEFICIT    History of Present Illness:    Alzheimer's dementia (CMS HCC)  CHRONIC WITH PROGRESSION. HAS BEEN ON Aricept 5 MG PER NEUROLOGIST WHO FOLLOWS HIM . WELL TOLERATED SO ADVISED TO INCREASE TO 10 MG DAILY  Paroxysmal atrial fibrillation (CMS HCC)  CHRONIC WITH NO KNOWN RECENT EPISODES OF A FIB. IS ON ELIQUIS AND IN NSR AT THIS TIME  Mixed hypercholesterolemia and hypertriglyceridemia  HPI for HYPERLIPIDEMIA  Pt is following low fat, low carb diet.  Patient's treatment regimen includes Diet and Statin  Pt understands imprortance of keeping lipids under control to prevent heart and vascular disease.  Onset: Years  Severity: Moderate  Associated Comorbidities: None    PATIENT UTD WITH IMMUNIZATIONS INCLUDING FLU SHOT  The history is provided by the patient.      Allergies:   No Known Allergies      Medications:     .  apixaban (ELIQUIS) 5 mg Oral Tablet, Take by mouth Twice daily    .  metoprolol succinate (TOPROL-XL) 25 mg Oral Tablet Sustained Release 24 hr, Take 25 mg by mouth Once a day    .  venlafaxine (EFFEXOR XR) 150 mg Oral Capsule, Sust. Release 24 hr, Take 150 mg by mouth Once a day    .  atorvastatin (LIPITOR) 40 mg Oral Tablet, Take 1 Tab (40 mg total) by mouth Every evening for 90 days    .  donepezil (ARICEPT) 5 mg Oral Tablet, Take 10 mg by mouth Every night    No facility-administered medications prior to visit.         Immunization History:     Immunization History   Administered Date(s) Administered   . Influenza Vaccine, 6 month-adult 07/01/2018, 06/11/2019   . Pneumovax 06/21/2017   . Prevnar 13 (Admin) 06/06/2016   . Shingrix - Zoster Vaccine (Admin) 07/14/2017, 10/12/2017         Past Medical History:     Past Medical  History:   Diagnosis Date   . Atrial fibrillation (CMS HCC)    . Back pain, chronic    . Duodenal ulcer     hx of: acute, hemorrhage   . GERD (gastroesophageal reflux disease) 01/28/2018   . History of short term memory loss    . Mitral valve prolapse    . 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 Obstructive sleep apnea 01/28/2018   . Palpitations 01/28/2018             Past Surgical History:     Past Surgical History:   Procedure Laterality Date   . COLONOSCOPY  2009   . HX HERNIA REPAIR     . HX VASECTOMY     . LAMINECTOMY               Family History:     Family Medical History:     Problem Relation (Age of Onset)    Aortic Aneurysm Maternal Grandfather    No Known Problems Mother, Father                Social History:   [name]  reports that he has quit smoking.  His smoking use included cigarettes. He has a 8.00 pack-year smoking history. He has never used smokeless tobacco. He reports current alcohol use. He reports that he does not use drugs.     Review of Systems: In addition to HPI  Review of Systems   Constitutional: Negative for chills, diaphoresis and fever.   HENT: Negative for ear pain and nosebleeds.    Eyes: Negative for double vision and discharge.   Respiratory: Negative for hemoptysis.    Cardiovascular: Negative for chest pain and palpitations.   Gastrointestinal: Negative for blood in stool and melena.   Genitourinary: Negative for hematuria.   Musculoskeletal: Negative for myalgias.   Skin: Negative for rash.   Neurological: Negative for tremors, speech change and focal weakness.   Endo/Heme/Allergies: Does not bruise/bleed easily.   Psychiatric/Behavioral: Positive for memory loss. Negative for hallucinations.   All other systems reviewed and are negative.        Objective:   Vitals:    Vitals:    07/04/19 0958   BP: 110/70   Pulse: 67   Temp: 35.8 C (96.4 F)   TempSrc: Thermal Scan   SpO2: 96%   Weight: 70.2 kg (154 lb 12.2 oz)   Height: 1.88 m (6' 2.02")   BMI:  19.9         Wt Readings from Last 3 Encounters:   07/04/19 70.2 kg (154 lb 12.2 oz)   04/07/19 69 kg (152 lb 1.9 oz)   03/03/19 68.9 kg (151 lb 14.4 oz)      Body mass index is 19.86 kg/m.  Physical Exam   Constitutional: He is oriented to person, place, and time and well-developed, well-nourished, and in no distress.   HENT:   Head: Normocephalic and atraumatic.   Right Ear: External ear normal.   Left Ear: External ear normal.   Mouth/Throat: Oropharynx is clear and moist.   Eyes: Pupils are equal, round, and reactive to light. No scleral icterus.   Neck: Neck supple. No JVD present. No tracheal deviation present. No thyromegaly present.   Cardiovascular: Normal rate, regular rhythm and normal heart sounds.   No murmur heard.  Pulmonary/Chest: Breath sounds normal. He has no rales.   Abdominal: Soft. Bowel sounds are normal. He exhibits no mass.   Musculoskeletal:         General: No deformity or edema.   Neurological: He is alert and oriented to person, place, and time.   POOR SHORT TERM MEMORY   Skin: Skin is warm and dry.   Psychiatric: Affect normal.   Nursing note and vitals reviewed.      POCT Results:         Occult Blood Lot #: [id number]  Expiration Date: 10/12/19  Initials: dlw         I have reviewed and interpreted  the point of care( POCT) results in this note and used this information in my assessment. Interperation: None performed Today  Claudette Head, MD  07/04/2019, 10:32      There are no exam notes on file for this visit.    Assessment & Plan:   1. Alzheimer's dementia (CMS HCC)  INCREASE Aricept TO 10 MG DAILY, KEEP SCHEDULED FOLLOW UP WITH NEUROLOGIST    2. Paroxysmal atrial fibrillation (CMS HCC)  CONTINUE TO MONITOR AND CONTINUE ELIQUIS    3. Mixed hypercholesterolemia and hypertriglyceridemia  ASSESSMENT  OF PROBLEM  HYPERLIPIDEMIA  ASSESSMENT  OF PROBLEM  Controlled and Stable    CMP  Lab  Results   Component Value Date/Time    CALCIUM 9.3 03/25/2019    PHOSPHORUS 3.2 01/04/2016 03:40 AM     ALBUMIN 4.1 02/25/2019    TOTALPROTEIN 7.5 02/25/2019    ALKPHOS 42 (L) 02/25/2019    AST 44 (H) 02/25/2019    ALT 21 06/23/2017    TOTBILIRUBIN 0.50 02/25/2019     LIPIDS  Lab Results   Component Value Date    CHOLESTEROL 166 06/28/2018    HDLCHOL 64 06/28/2018    LDLCHOL 94 06/28/2018    TRIG 42 06/28/2018      Continue low fat, low carb diet.  Call if you develop any muscle aches.  Continue your medication for Hyperlipidemia.  Get Lipid panel every 6-12 months.     - COMPREHENSIVE METABOLIC PANEL, NON-FASTING; Future  - LIPID PANEL; Future  - LIPID PANEL  - COMPREHENSIVE METABOLIC PANEL, NON-FASTING     - CBC      Covid-19 Instructions and Precautions  Stay home and continue to isolate  Call the office if you are sick to get instructions.  Practice social distancing  Avoid touching eyes, nose, and mouth  Clean and disinfect surfaces  Wash your hands often with soap and water for 20 seconds  Call if any fever, increased SOB, cough or body aches     DISCUSSED FOLLOW UP IN 6 MO        Health Maintenance   Topic Date Due   . Hepatitis C screening  09/21/1988   . Colonoscopy  09/21/1993   . AAA Screening  09/21/2008   . Depression Screening  04/06/2020   . Adult Tdap-Td (2 - Td) 01/27/2025   . Pneumococcal 65+ Years Low Risk  Completed   . Influenza Vaccine  Completed   . Shingles Vaccine  Completed     Return if symptoms worsen or fail to improve.  Return in about 6 months (around 01/01/2020) for Follow up.  Lucia Bitterhomas Decklan Mau, MD 07/04/2019 10:32

## 2019-07-29 LAB — LIPID PANEL
CHOLESTEROL, TOTAL: 157 mg/dL (ref 100–199)
HDL CHOLESTEROL: 76 mg/dL (ref >39)
LDL CHOLESTEROL CALC: 71 mg/dL (ref 0–99)
TRIGLYCERIDES: 43 mg/dL (ref 0–149)
VLDL CHOLESTEROL CAL: 10 mg/dL (ref 5–40)

## 2019-07-29 LAB — CBC
HEMATOCRIT: 42.1 % (ref 37.5–51.0)
HEMOGLOBIN: 14.2 g/dL (ref 13.0–17.7)
MCH: 33.4 pg — ABNORMAL HIGH (ref 26.6–33.0)
MCHC: 33.7 g/dL (ref 31.5–35.7)
MCV: 99 fL — ABNORMAL HIGH (ref 79–97)
PLATELETS: 231 10*3/uL (ref 150–450)
RBC: 4.25 x10E6/uL (ref 4.14–5.80)
RDW: 12 % (ref 11.6–15.4)
WBC: 5.5 10*3/uL (ref 3.4–10.8)

## 2019-10-31 ENCOUNTER — Ambulatory Visit (INDEPENDENT_AMBULATORY_CARE_PROVIDER_SITE_OTHER): Payer: Self-pay | Admitting: Family Medicine

## 2019-10-31 NOTE — Telephone Encounter (Signed)
Directed patient to go to [facility] Med records to ask for report of procedure completed in 5/17. [facility] doesn't have a detailed report on the procedure wife is asking about. Wife has the number to request records.   Lovey Newcomer  10/31/2019, 13:35
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
## 2019-11-19 IMAGING — MR MRI LUMBAR SPINE WITHOUT CONTRAST
4 of 7 series · 17 of 48 positions shown · non-contrast
Comparison: Plain radiographs lumbar spine 10/30/2019.

MRI LUMBAR SPINE WITHOUT CONTRAST, 11/19/2019 [DATE]: 
CLINICAL INDICATION:  Chronic low back pain. History of prior lumbar surgery.
TECHNIQUE: Multiplanar, multiecho position MR images of the lumbar spine were 
performed without intravenous contrast enhancement.

[Series 101: survey · axial · 10.0mm · 1.39mm/px · z∈[-33,+201]mm · 2 of 10 slices shown]
[im 1/10]
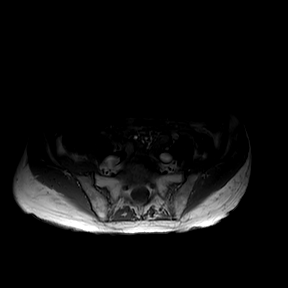
[im 10/10]
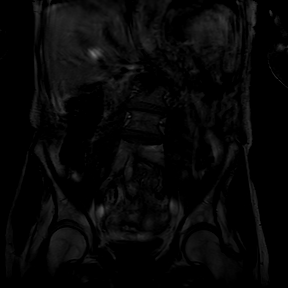

[Series 201: t2w_cor-surv · coronal · 6.0mm · 0.60mm/px · 2 of 5 slices shown]
[im 1/5]
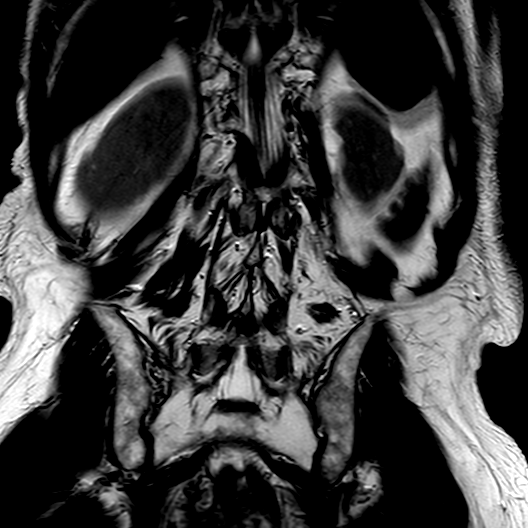
[im 5/5]
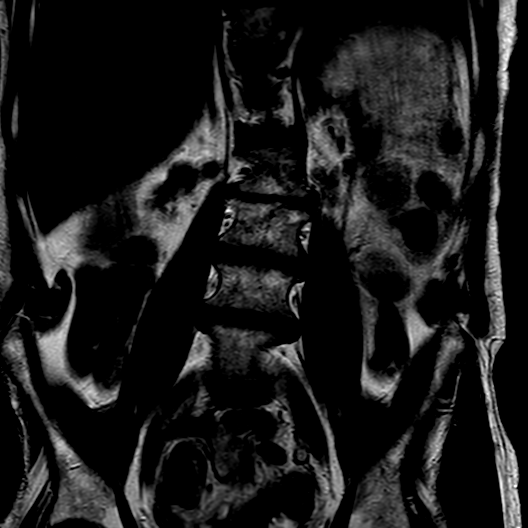

[Series 301: t1_tse_sag · sagittal · 4.0mm · 0.48mm/px · 3 of 17 slices shown]
[im 4/17]
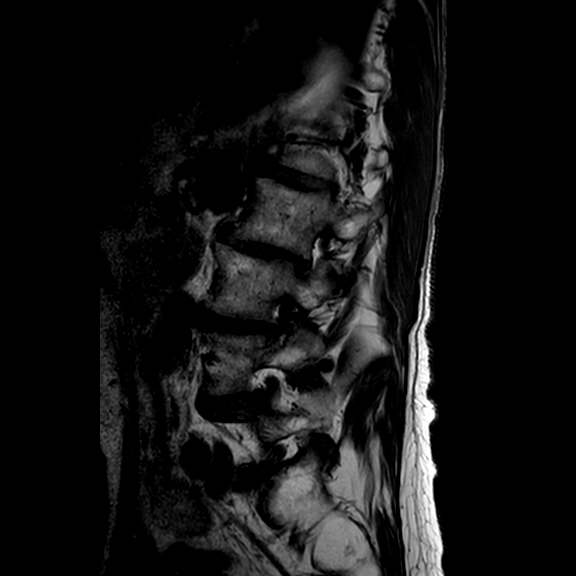
[im 10/17]
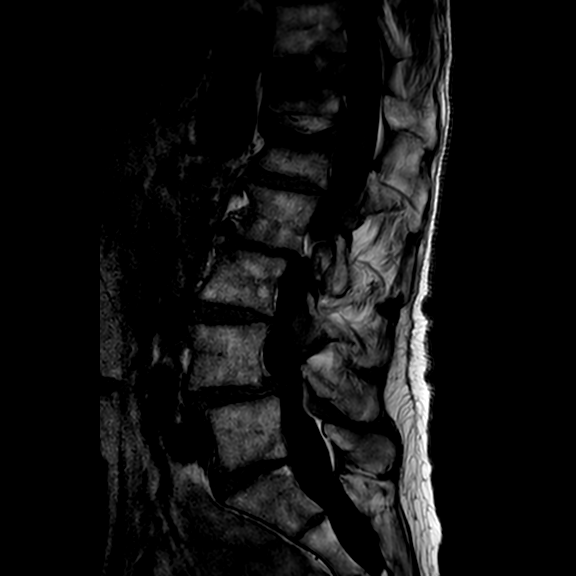
[im 17/17]
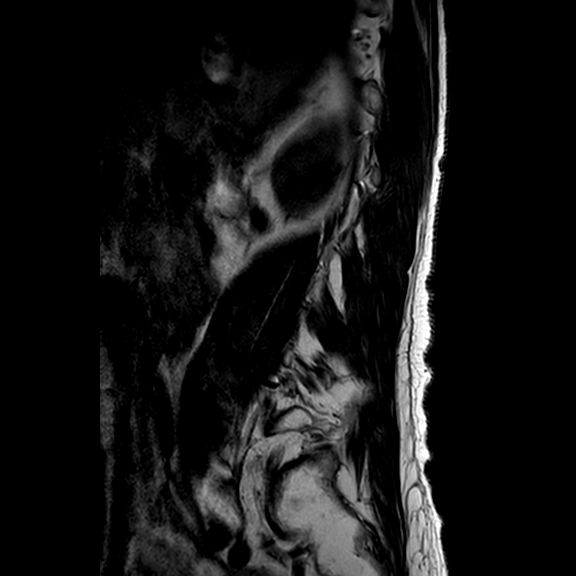

[Series 701: T1 · axial · 4.0mm · 0.38mm/px · z∈[-28,+191]mm · 10 of 50 slices shown]
[im 4/50]
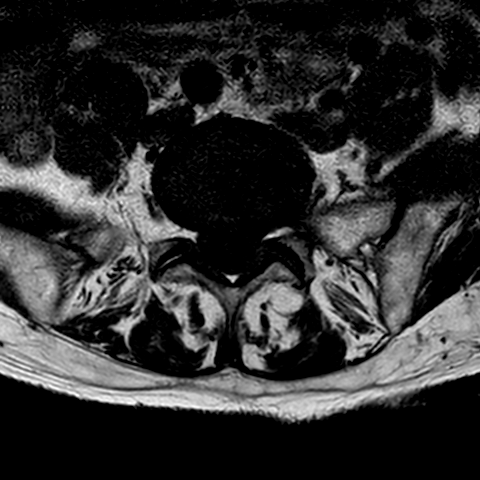
[im 7/50]
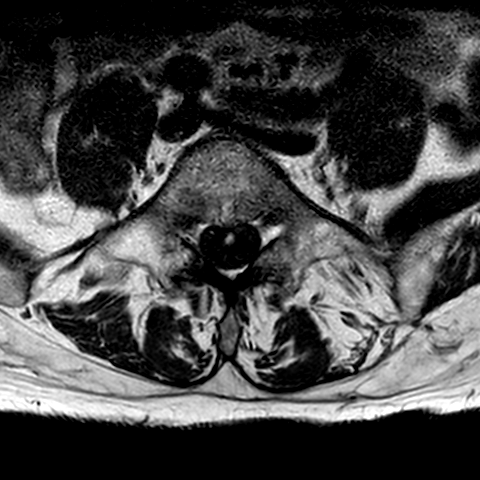
[im 10/50]
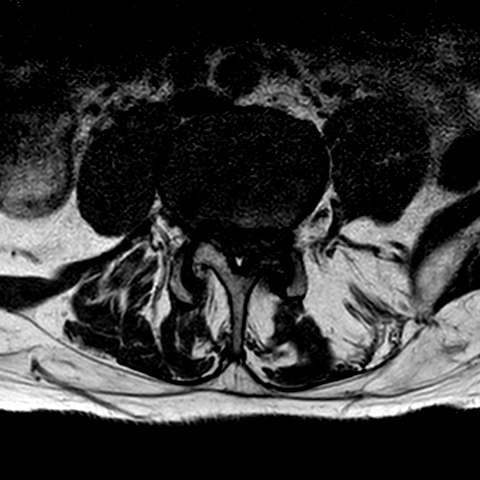
[im 17/50]
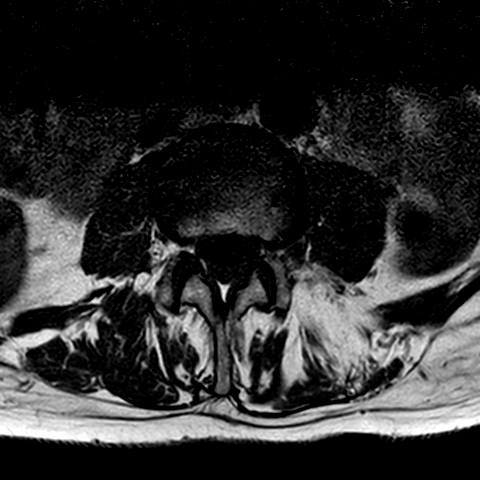
[im 23/50]
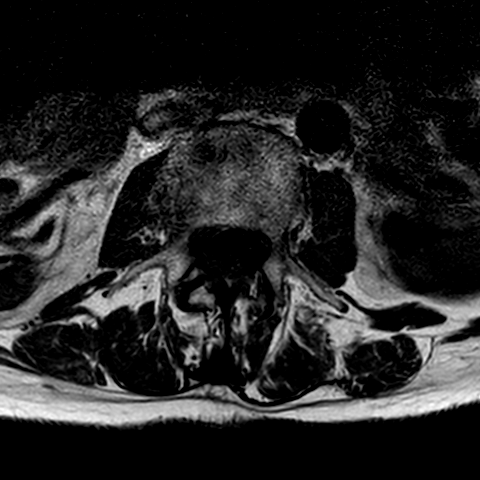
[im 27/50]
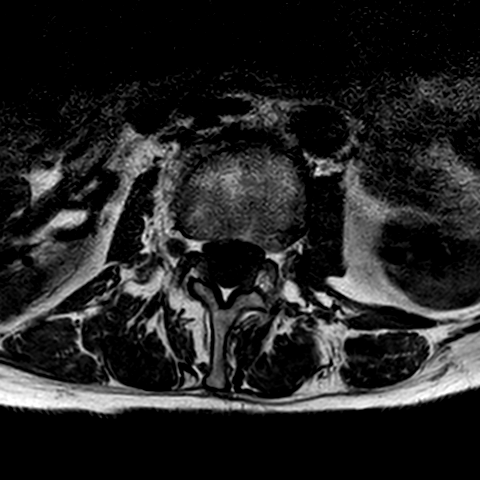
[im 30/50]
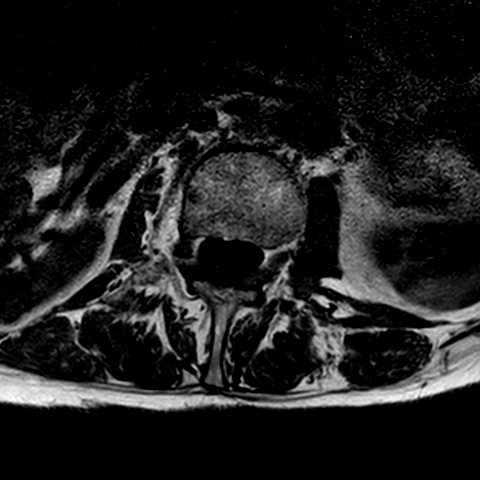
[im 36/50]
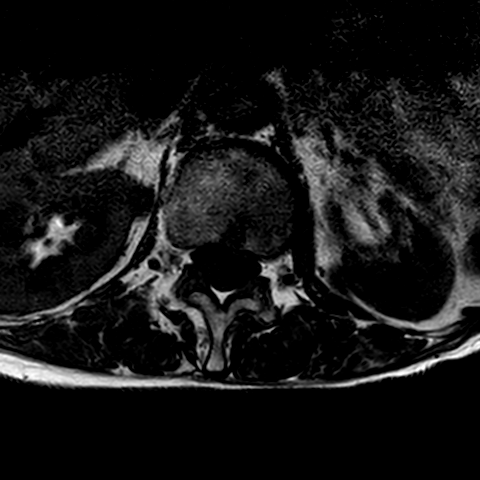
[im 43/50]
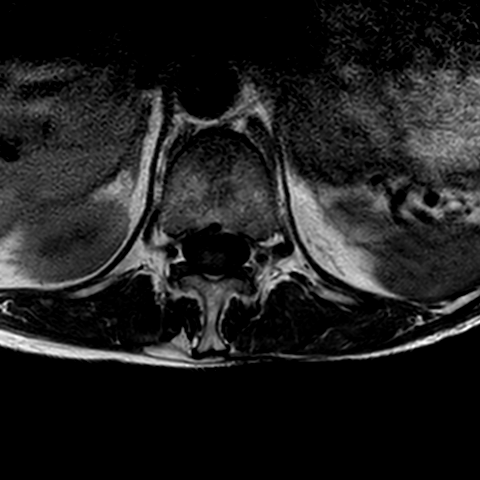
[im 50/50]
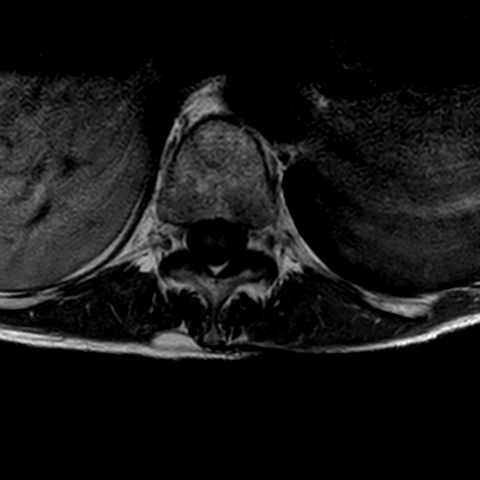

[17 of 48 positions shown; findings below may reference images not displayed]

FINDINGS: The coronal series reveals mild levoscoliotic curvature centered at the L2 
vertebral body. Sagittal images show that there is approximately 6 mm of 
anterolisthesis of L4 over L5. At L2-3 there is approximately 5 mm of 
retrolisthesis. Alignment is otherwise unremarkable except for mild kyphotic 
curvature at the thoracolumbar junction due to contiguous compression 
deformities of T12 and L1. More specifically at L1 there is a superior endplate 
compression deformity with approximately 30% loss of height. There is a band of 
bone marrow edema subjacent to the compressed superior endplate compatible with 
this fracture having occurred recently. The superior endplate compression 
fracture of L1 is approximately 20%. There is a band of edema subjacent to the 
compressed superior vertebral body indicating that this fracture occurred 
recently. The appearance of both of these fractures are consistent with benign 
pathology. The remaining lumbar vertebral bodies are normal in height. The 
partially visible T11 vertebral body is normal. There is no retropulsion 
associated with the T12 fracture. There is mild posterior inferior RIGHT 
paracentral retropulsion of L1. 
The cord terminates at L1. No cauda equina pathology is detected. 
The T11-12 disc is included on sagittal images only. Chronic Schmorl's nodes are 
present. Minimal posterior disc bulging is present. It appears more pronounced 
on the RIGHT side of the disc. There is mild relative central canal stenosis. No 
foraminal narrowing. 
At T12-L2 the disc is normal in height. There is a small posterocentral 
broad-based subligamentous disc protrusion exhibiting 6 mm of superior extrusion 
not impinging upon any neural elements. No significant central canal stenosis. 
Mild degenerative changes are in the facet joints at this level. There is no 
foraminal narrowing. 
At L1-2 the disc exhibits moderate loss of height. There is mild diffuse 
posterior annular bulge. Mild degenerative changes are in the facet joints with 
mild ligamentum flavum thickening. Mild overall central canal stenosis. As 
discussed above there is mild retropulsed bone on the LEFT posterior aspect of 
the inferior L1 vertebral body producing mild LEFT lateral recess stenosis 
without nerve impingement. No significant foraminal narrowing. 
At L2-3 the mild retrolisthesis is again noted. The disc is severely narrowed. 
There is RIGHT paracentral posterior spondylosis. There are mild degenerative 
changes in the facet joints with mild ligamentum flavum thickening. There is are 
central canal stenosis overall. No significant LEFT lateral recess stenosis is 
present. Because of asymmetric posterior spondylosis however there is severe 
RIGHT lateral recess stenosis with potential impingement upon the RIGHT L3 
nerve. Left foramen is normal. There is mild RIGHT foraminal stenosis without 
nerve impingement. 
At L3-4 the disc exhibits moderate loss of height overall. There is mild diffuse 
posterior annular bulge. Moderate degenerative changes are in the facet joints 
with hypertrophy and mild ligamentum flavum thickening. Moderate central spinal 
canal stenosis is present. There is moderate bilateral lateral recess stenosis 
with potential nerve impingement. Moderate bilateral foraminal narrowing is 
present without visible nerve impingement. 
At L4-5 the degenerative anterolisthesis is again noted. There is moderate 
diffuse posterior annular bulge. Severe degenerative changes involve both facet 
joints with ligamentum flavum thickening. Severe central spinal canal stenosis 
is present. Both lateral recesses are completely effaced. The disc bulging 
extends into both inferior foramina. There appears to be potential impingement 
upon the intraforaminal RIGHT L4 nerve. No nerve impingement found on the LEFT 
side. 
At L5-S1 the disc is moderately to severely narrowed with mild diffuse posterior 
annular bulge. There are mild degenerative changes in the facet joints with mild 
hypertrophy on the LEFT side. There is mild central canal stenosis. There is 
moderate LEFT lateral recess stenosis with potential nerve impingement. No 
significant foraminal narrowing. 
Incidental note is made of moderate to severe atrophy of the LEFT kidney.
IMPRESSION: 1. There are mild recent appearing and benign appearing superior endplate 
compression deformities of both T12 and L1 potentially amenable to treatment 
with percutaneous methods. 
2. Extensive degenerative changes described in detail above. The most clinically 
significant abnormalities include the severe central and severe bilateral 
lateral recess stenosis at L4-5 where there is also potential impingement upon 
the intraforaminal RIGHT L4 nerve. At L2-3 there is severe RIGHT lateral recess 
stenosis with potential neural impingement which would affect L3. There is 
potential neural impingement on the LEFT side at L5-S1 and bilaterally at L3-4 
due to lateral recess stenosis.

## 2019-12-05 ENCOUNTER — Ambulatory Visit (INDEPENDENT_AMBULATORY_CARE_PROVIDER_SITE_OTHER): Payer: Self-pay | Admitting: Family Medicine

## 2019-12-05 NOTE — Telephone Encounter (Signed)
Wife states Patient had MRI in [place], but was unable to schedule ortho appt. They came home and was seen at [facility] and the doctor instructed them to see PCP to see if PCP agree's to Fosamax for bones. Patient's MRI showed Compression FX, He had Dexa Scan completed yesterday at [facility], but has not received the results. (They asked for both MRI and Dexa Scan results be sent to [facility] and MRI did not go through). She is going to take a picture of MRI results and put them in My Chart so you can see the results. She is also sending this message through My Chart to you.  Lovey Newcomer  12/05/2019, 14:01

## 2019-12-05 NOTE — Telephone Encounter (Signed)
Regarding: consult  ----- Message from Darcella Cheshire sent at 12/05/2019 12:16 PM EDT -----  Lucia Bitter, MD      Pt is requesting for Dr. Laural Benes to return his call, concerning pt's MRI that was taken in [place].

## 2019-12-08 NOTE — Telephone Encounter (Signed)
CALLED AND DISCUSSED OSTEOPOROSIS AND PROBABLE NEED FOR FOSAMAX. WILL DECIDE WHEN RESULTS ARE AVAILABLE

## 2019-12-24 ENCOUNTER — Encounter (INDEPENDENT_AMBULATORY_CARE_PROVIDER_SITE_OTHER): Payer: Self-pay | Admitting: Family Medicine

## 2020-06-17 ENCOUNTER — Other Ambulatory Visit (INDEPENDENT_AMBULATORY_CARE_PROVIDER_SITE_OTHER): Payer: Self-pay | Admitting: Family Medicine

## 2020-06-17 NOTE — Nursing Note (Signed)
Last scheduled appointment with you was 07/03/20.  Currently scheduled future appointment is 07/28/2020.    Patient has been seen within the last year: Yes.    Confirmed preferred pharmacy for this refill encounter is   Preferred Pharmacy     [id number] [place], [place] - [address]    [address] [place] [address]    Phone: [phone] Fax: [phone]    Hours: Not open 24 hours    [id number] - [place] - [address] AT [facility] [address]    [address] [place] [address]    Phone: [phone] Fax: [phone]    Hours: Not open 24 hours    [id number] [place], [place] - [address]    [address] [place] [address]    Phone: [phone] Fax: [phone]    Hours: Not open 24 hours      .     [place] Mason Dibiasio, RN  06/17/2020, 10:27

## 2020-07-28 ENCOUNTER — Ambulatory Visit (INDEPENDENT_AMBULATORY_CARE_PROVIDER_SITE_OTHER): Payer: Self-pay | Admitting: Family Medicine

## 2020-09-03 ENCOUNTER — Encounter (INDEPENDENT_AMBULATORY_CARE_PROVIDER_SITE_OTHER): Payer: Self-pay

## 2021-02-09 ENCOUNTER — Telehealth (INDEPENDENT_AMBULATORY_CARE_PROVIDER_SITE_OTHER): Payer: Self-pay | Admitting: Family Medicine

## 2021-02-09 DIAGNOSIS — U071 COVID-19: Secondary | ICD-10-CM

## 2021-02-09 NOTE — Telephone Encounter (Signed)
Called by wife stating that patient is positive today and due to meds he is on unable to take paxlovid.  Monoclonal antibody seems most appropriate.  Call for return appointment if not improving or symptoms worsening. To ER with chest pain or sob

## 2021-02-10 ENCOUNTER — Ambulatory Visit (INDEPENDENT_AMBULATORY_CARE_PROVIDER_SITE_OTHER): Payer: Self-pay | Admitting: Family Medicine

## 2021-02-10 ENCOUNTER — Encounter (INDEPENDENT_AMBULATORY_CARE_PROVIDER_SITE_OTHER): Payer: Self-pay | Admitting: Family Medicine

## 2021-02-10 NOTE — Nursing Note (Signed)
I called and spoke to patients wife [name] to confirm date of symptoms and test. She said that his symptoms started yesterday and she tested him yesterday afternoon.  COVID infusion order and paperwork have been completed and faxed to [facility] centralized scheduling at fax # [phone].  [place] Evie Crumpler, RN  02/10/2021, 08:21

## 2021-02-10 NOTE — Telephone Encounter (Signed)
The call center transferred [facility] with [facility] pharmacy to me. He said that he received the monoclonal antibody infusion form from [place] and wants to know if the patient is able to take Paxlovid as that is the first line of treatment now for COVID. I informed him that Dr.Johnson placed a note yesterday and said that due to the medications that patient is on he is unable to take the Paxlovid. I read him off the medications patient is taking. He said that if patient holds the Lipitor he would be able to take the Paxlovid. He said that he would call the patient and discuss this with him and if they need anymore information he will call us back.  [place] Derward Marple, RN  02/10/2021, 09:22

## 2021-02-11 DIAGNOSIS — U071 COVID-19: Secondary | ICD-10-CM

## 2021-02-11 HISTORY — DX: COVID-19: U07.1

## 2021-04-11 ENCOUNTER — Ambulatory Visit (INDEPENDENT_AMBULATORY_CARE_PROVIDER_SITE_OTHER): Payer: Self-pay | Admitting: Family Medicine

## 2021-04-11 MED ORDER — QUETIAPINE 25 MG TABLET
25.00 mg | ORAL_TABLET | Freq: Two times a day (BID) | ORAL | 3 refills | Status: DC | PRN
Start: 2021-04-11 — End: 2021-04-20

## 2021-04-11 NOTE — Telephone Encounter (Signed)
I called and spoke to patients wife [name]. She said that she really just needs to speak to Dr.Johnson and would not give me anymore information.  Henry Zissel Biederman, RN  04/11/2021, 13:36

## 2021-04-11 NOTE — Telephone Encounter (Signed)
CALLED AND DISCUSSED WITH CLAIRE. PATIENT WITH SOME AGGITATION .Marland Kitchen SHORT TERM TRIAL QUETIAPINE

## 2021-04-11 NOTE — Telephone Encounter (Signed)
Regarding: Return call requested  ----- Message from Zorita Pang Cogar sent at 04/11/2021  1:30 PM EDT -----  Lucia Bitter, MD    LOV: 07-04-2019  NOV: None scheduled    Wanted you to know patient will be seeing geriatric psychiatry. Patient's wife has a question about medications.  Appointment offered and declined.  Please call to advise.

## 2021-04-15 ENCOUNTER — Other Ambulatory Visit: Payer: Self-pay

## 2021-04-15 ENCOUNTER — Encounter (INDEPENDENT_AMBULATORY_CARE_PROVIDER_SITE_OTHER): Payer: Self-pay | Admitting: Family Medicine

## 2021-04-15 ENCOUNTER — Ambulatory Visit (INDEPENDENT_AMBULATORY_CARE_PROVIDER_SITE_OTHER): Admitting: Family Medicine

## 2021-04-15 VITALS — BP 116/54 | HR 70 | Temp 98.1°F | Ht 72.44 in | Wt 155.4 lb

## 2021-04-15 DIAGNOSIS — F0280 Dementia in other diseases classified elsewhere without behavioral disturbance: Secondary | ICD-10-CM

## 2021-04-15 DIAGNOSIS — R5383 Other fatigue: Secondary | ICD-10-CM

## 2021-04-15 DIAGNOSIS — E782 Mixed hyperlipidemia: Secondary | ICD-10-CM

## 2021-04-15 DIAGNOSIS — Z0000 Encounter for general adult medical examination without abnormal findings: Secondary | ICD-10-CM

## 2021-04-15 DIAGNOSIS — G309 Alzheimer's disease, unspecified: Secondary | ICD-10-CM

## 2021-04-15 DIAGNOSIS — I480 Paroxysmal atrial fibrillation: Secondary | ICD-10-CM

## 2021-04-15 NOTE — Progress Notes (Signed)
FAMILY MEDICINE, [facility]  [address] [place] [address]  [facility]   Annual Preventive Health Care Visit    Name: [name] MRN:  [id number]   Date: 04/15/2021 Age: 75 y.o.       SUBJECTIVE:   [name] is a 75 y.o. male for presenting for  Annual Preventive Health Care Visit and Examination.   Chronic Medical Problems addressed today:  1. Annual physical exam  REVIEWED RECENT MEDICAL HISTORY.     2. Alzheimer's dementia (CMS HCC)  PATIENT WITH WORSENING MEMORY AND OCCASIONAL PERODS OF AGGITATION BECOMING MORE OF AN ISSUE  - THYROID STIMULATING HORMONE WITH FREE T4 REFLEX; Future  - FOLATE; Future  - VITAMIN B12; Future    3. Paroxysmal atrial fibrillation (CMS HCC)  PULSE IS REGULAR TODAY. ON ELIQUS AND METOPROL WITH RATE CONTROL   - LIPID PANEL; Future   - CBC; Future  - COMPREHENSIVE METABOLIC PANEL, NON-FASTING; Future      I have reviewed and reconciled the medication list with the patient today.    I have reviewed and updated as appropriate the past medical, family and social history. 04/15/2021 as summarized below:  Past Medical History:   Diagnosis Date   . Atrial fibrillation (CMS HCC)    . Back pain, chronic    . COVID-19 02/2021   . Duodenal ulcer     hx of: acute, hemorrhage   . GERD (gastroesophageal reflux disease) 01/28/2018   . History of short term memory loss    . Mitral valve prolapse    . 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 Obstructive sleep apnea 01/28/2018   . Palpitations 01/28/2018     Past Surgical History:   Procedure Laterality Date   . Colonoscopy  2009   . Hx hernia repair     . Hx vasectomy     . Laminectomy       Current Outpatient Medications   Medication Sig   . apixaban (ELIQUIS) 5 mg Oral Tablet Take by mouth Twice daily   . atorvastatin (LIPITOR) 40 mg Oral Tablet TAKE 1 TAB (40 MG TOTAL) BY MOUTH EVERY EVENING   . donepeziL (ARICEPT) 10 mg Oral Tablet Take 10 mg by mouth Every night   . memantine (NAMENDA) 10 mg  Oral Tablet Take 10 mg by mouth Twice daily   . metoprolol succinate (TOPROL-XL) 25 mg Oral Tablet Sustained Release 24 hr Take 25 mg by mouth Once a day   . QUEtiapine (SEROQUEL) 25 mg Oral Tablet Take 1 Tablet (25 mg total) by mouth Twice per day as needed for up to 90 days   . venlafaxine (EFFEXOR XR) 150 mg Oral Capsule, Sust. Release 24 hr Take 150 mg by mouth Once a day     Family Medical History:     Problem Relation (Age of Onset)    Aortic Aneurysm Maternal Grandfather    No Known Problems Mother, Father          Social History     Socioeconomic History   . Marital status: Married   Tobacco Use   . Smoking status: Former Smoker     Packs/day: 1.00     Years: 8.00     Pack years: 8.00     Types: Cigarettes   . Smokeless tobacco: Never Used   Vaping Use   . Vaping Use: Never used   Substance and Sexual Activity   . Alcohol use: Yes  Comment: 1-2 glasse wine on dinner daily, beer once every while    . Drug use: Never     Review of Systems:   Review of Systems   Constitutional: Negative for chills, diaphoresis and fever.   HENT: Negative for ear pain and nosebleeds.    Eyes: Negative for double vision and discharge.   Respiratory: Negative for hemoptysis.    Cardiovascular: Negative for chest pain and palpitations.   Gastrointestinal: Negative for blood in stool and melena.   Genitourinary: Negative for hematuria.   Musculoskeletal: Positive for back pain. Negative for myalgias.   Skin: Negative for rash.   Neurological: Negative for tremors, speech change and focal weakness.   Endo/Heme/Allergies: Does not bruise/bleed easily.   Psychiatric/Behavioral: Positive for memory loss. Negative for hallucinations.   All other systems reviewed and are negative.      Health Maintenance   Topic Date Due   . Hepatitis C screening  Never done   . Annual Wellness Visit - Calendar Year Insurers  08/14/2020   . Influenza Vaccine (1) 04/14/2021   . Depression Screening  04/15/2022   . Adult Tdap-Td (2 - Td or Tdap)  01/27/2025   . Shingles Vaccine  Completed   . Covid-19 Vaccine  Completed   . Pneumococcal Vaccination, Age 72+  Completed   . Meningococcal Vaccine  Aged Out       OBJECTIVE:   BP (!) 116/54   Pulse 70   Temp 36.7 C (98.1 F) (Thermal Scan)   Ht 1.84 m (6' 0.44")   Wt 70.5 kg (155 lb 6.8 oz)   SpO2 97%   BMI 20.82 kg/m        Physical Exam:  Physical Exam  Vitals and nursing note reviewed.   HENT:      Head: Normocephalic and atraumatic.      Right Ear: External ear normal.      Left Ear: External ear normal.   Eyes:      General: No scleral icterus.     Pupils: Pupils are equal, round, and reactive to light.   Neck:      Thyroid: No thyromegaly.      Vascular: No JVD.      Trachea: No tracheal deviation.   Cardiovascular:      Rate and Rhythm: Normal rate and regular rhythm.      Heart sounds: Normal heart sounds. No murmur heard.  Pulmonary:      Breath sounds: Normal breath sounds. No rales.   Abdominal:      General: Bowel sounds are normal.      Palpations: Abdomen is soft. There is no mass.   Musculoskeletal:         General: No deformity.      Cervical back: Neck supple.   Skin:     General: Skin is warm and dry.   Neurological:      Mental Status: He is alert and oriented to person, place, and time.   Psychiatric:         Mood and Affect: Affect normal.               I have reviewed and interpreted  the point of care( POCT) results in this note and used this information in my assessment. Interperation: None performed Today  Lucia Bitter, MD  04/15/2021, 13:47            Health Maintenance Due   Topic Date Due   . Hepatitis C screening  Never done   . Annual Wellness Visit - Calendar Year Insurers  08/14/2020   . Influenza Vaccine (1) 04/14/2021        ASSESSMENT & PLAN:   1. Annual physical exam  DISCUSSED UPCOMING COVID BOOSTER AND EARLY TESTING TO CONSIDER ADJUNCTIVE THERAPY    2. Alzheimer's dementia (CMS HCC)  PATIENT HERE WITH HIS WIFE. NO ON NAMENDA AND ARICEPT WITH OUT MUCH  BENEFIT.  AGGITATION BECOMING AND ISSURE    DISCUSSED SEROQUEL AND APPROPRIATE DOSING. VERY LOW DOSE (1/2 OR 25 MG TAB) INITIALLY FOR EPISODES WITH SHORT TERM FOLLOW UP. WILL DISCUSS FURTHER AS INFOMATION ABOUT HIS NEED AND TOLERANCE ARE KNOWN    FOLLOWED BY NEUROLOGY  - THYROID STIMULATING HORMONE WITH FREE T4 REFLEX; Future  - FOLATE; Future  - VITAMIN B12; Future    3. Paroxysmal atrial fibrillation (CMS HCC)  CONTINUE MONITORING . FOLLOWED BY CARDIOLOGY   - LIPID PANEL; Future   - CBC; Future  - COMPREHENSIVE METABOLIC PANEL, NON-FASTING; Future      Identified Risk Factors/ Recommended Actions             Orders Placed This Encounter   . CBC   . LIPID PANEL   . COMPREHENSIVE METABOLIC PANEL, NON-FASTING   . THYROID STIMULATING HORMONE WITH FREE T4 REFLEX   . FOLATE   . VITAMIN B12        The patient has been evaluated today. Screening has been done and abnormalities discussed. All immunizations have been brought up to date unless pt refused or has a medical contraindication. The patient's questions have been answered and follow-up has been arranged. Medicare wellness parameters have been evaluated and addressed if applicable. Routine follow-up has been arranged for chronic medical conditions and any new acute medical concerns addressed today.  The patient has been educated about risk factors and recommended preventive care. Written Prevention Plan completed/ updated and given to patient (see After Visit Summary).    Return in about 5 days (around 04/20/2021) for Follow up.    Lucia Bitter, MD  [facility]  FAMILY MEDICINE, [facility]  [address] [place] [address]  [phone]

## 2021-04-19 ENCOUNTER — Encounter (FREE_STANDING_LABORATORY_FACILITY): Admitting: Family Medicine

## 2021-04-19 ENCOUNTER — Encounter (FREE_STANDING_LABORATORY_FACILITY)
Admit: 2021-04-19 | Discharge: 2021-04-19 | Disposition: A | Discharge status: Home / Self Care | Attending: Family Medicine | Admitting: Family Medicine

## 2021-04-19 ENCOUNTER — Ambulatory Visit (INDEPENDENT_AMBULATORY_CARE_PROVIDER_SITE_OTHER)

## 2021-04-19 ENCOUNTER — Other Ambulatory Visit: Payer: Self-pay

## 2021-04-19 DIAGNOSIS — E782 Mixed hyperlipidemia: Secondary | ICD-10-CM | POA: Insufficient documentation

## 2021-04-19 DIAGNOSIS — G309 Alzheimer's disease, unspecified: Secondary | ICD-10-CM

## 2021-04-19 DIAGNOSIS — R5383 Other fatigue: Secondary | ICD-10-CM

## 2021-04-19 DIAGNOSIS — F0280 Dementia in other diseases classified elsewhere without behavioral disturbance: Secondary | ICD-10-CM | POA: Insufficient documentation

## 2021-04-19 LAB — LIPID PANEL
CHOL/HDL RATIO: 2.4
CHOLESTEROL: 153 mg/dL (ref 100–200)
HDL CHOL: 63 mg/dL (ref >=50)
LDL CALC: 82 mg/dL (ref <100)
NON-HDL: 90 mg/dL (ref <=190)
TRIGLYCERIDES: 42 mg/dL (ref <150)
VLDL CALC: 8 mg/dL (ref <30)

## 2021-04-19 LAB — CBC
HCT: 40.8 % (ref 38.9–52.0)
HGB: 13.5 g/dL (ref 13.4–17.5)
MCH: 32.3 pg — ABNORMAL HIGH (ref 26.0–32.0)
MCHC: 33.1 g/dL (ref 31.0–35.5)
MCV: 97.6 fL (ref 78.0–100.0)
MPV: 11.8 fL (ref 8.7–12.5)
PLATELETS: 230 10*3/uL (ref 150–400)
RBC: 4.18 10*6/uL — ABNORMAL LOW (ref 4.50–6.10)
RDW-CV: 14.3 % (ref 11.5–15.5)
WBC: 4.4 10*3/uL (ref 3.7–11.0)

## 2021-04-19 LAB — COMPREHENSIVE METABOLIC PANEL, NON-FASTING
ALBUMIN: 3.5 g/dL (ref 3.4–4.8)
ALKALINE PHOSPHATASE: 36 U/L — ABNORMAL LOW (ref 45–115)
ALT (SGPT): 16 U/L (ref 10–55)
ANION GAP: 5 mmol/L (ref 4–13)
AST (SGOT): 18 U/L (ref 8–45)
BILIRUBIN TOTAL: 0.4 mg/dL (ref 0.3–1.3)
BUN/CREA RATIO: 32 — ABNORMAL HIGH (ref 6–22)
BUN: 25 mg/dL (ref 8–25)
CALCIUM: 9.3 mg/dL (ref 8.8–10.2)
CHLORIDE: 103 mmol/L (ref 96–111)
CO2 TOTAL: 30 mmol/L (ref 23–31)
CREATININE: 0.79 mg/dL (ref 0.75–1.35)
ESTIMATED GFR: >90 mL/min/BSA (ref >=60)
GLUCOSE: 86 mg/dL (ref 65–125)
POTASSIUM: 4.4 mmol/L (ref 3.5–5.1)
PROTEIN TOTAL: 6.3 g/dL (ref 6.0–8.0)
SODIUM: 138 mmol/L (ref 136–145)

## 2021-04-19 LAB — FOLATE: FOLATE: 11.2 ng/mL (ref 7.0–31.0)

## 2021-04-19 LAB — THYROID STIMULATING HORMONE WITH FREE T4 REFLEX: TSH: 1.44 u[IU]/mL (ref 0.430–3.550)

## 2021-04-19 LAB — VITAMIN B12: VITAMIN B 12: 648 pg/mL (ref 200–900)

## 2021-04-19 NOTE — Nursing Note (Signed)
Department of Community Practice     Venipuncture performed in office on right arm antecubital vein, dry pressure dressing was applied to site and patient tolerated it well.  Specimen was centrifuged, aliquoted as needed and specimen was labeled and packaged for transport to [facility]. The following specimens were collected: 1 Gold Top Tube(s) and 1 Lavender Top Tube(s) (79ml)  Rayel Santizo A Mekiah Wahler, MA  04/19/2021, 09:03

## 2021-04-20 ENCOUNTER — Encounter (INDEPENDENT_AMBULATORY_CARE_PROVIDER_SITE_OTHER): Payer: Self-pay | Admitting: Family Medicine

## 2021-04-20 ENCOUNTER — Ambulatory Visit (INDEPENDENT_AMBULATORY_CARE_PROVIDER_SITE_OTHER): Admitting: Family Medicine

## 2021-04-20 VITALS — BP 110/50 | HR 45 | Temp 97.4°F | Ht 72.44 in | Wt 157.4 lb

## 2021-04-20 DIAGNOSIS — G309 Alzheimer's disease, unspecified: Secondary | ICD-10-CM

## 2021-04-20 DIAGNOSIS — F0280 Dementia in other diseases classified elsewhere without behavioral disturbance: Secondary | ICD-10-CM

## 2021-04-20 DIAGNOSIS — I480 Paroxysmal atrial fibrillation: Secondary | ICD-10-CM

## 2021-04-20 MED ORDER — QUETIAPINE 25 MG TABLET
25.0000 mg | ORAL_TABLET | Freq: Two times a day (BID) | ORAL | 3 refills | Status: AC | PRN
Start: 2021-04-20 — End: 2021-07-19

## 2021-04-20 NOTE — Progress Notes (Signed)
OUTPATIENT PROGRESS NOTE    Subjective:   Patient ID:  [name] is a pleasant 75 y.o. male.    Chief Complaint: Alzheimer Disease (Wife with patient. Lab results.  Started him out on 0.5 tablet twice a day.)      History of Present Illness:    Alzheimer's dementia (CMS HCC)  DISCUSSED WITH WIFE, PATIENT HAVING EPISODES OF MILD AGGITATION AND SHE IS CONCERNED HOW TO DEAL WITH THEM  Paroxysmal atrial fibrillation (CMS HCC)   PATIENT ON ELIQUIS WHICH HE IS TOLERATING WELL  RATE CONTROLED    The history is provided by the patient.      Allergies:   No Known Allergies      Medications:   apixaban (ELIQUIS) 5 mg Oral Tablet, Take by mouth Twice daily  atorvastatin (LIPITOR) 40 mg Oral Tablet, TAKE 1 TAB (40 MG TOTAL) BY MOUTH EVERY EVENING  donepeziL (ARICEPT) 10 mg Oral Tablet, Take 10 mg by mouth Every night  memantine (NAMENDA) 10 mg Oral Tablet, Take 10 mg by mouth Twice daily  metoprolol succinate (TOPROL-XL) 25 mg Oral Tablet Sustained Release 24 hr, Take 25 mg by mouth Once a day  venlafaxine (EFFEXOR XR) 150 mg Oral Capsule, Sust. Release 24 hr, Take 150 mg by mouth Once a day  QUEtiapine (SEROQUEL) 25 mg Oral Tablet, Take 1 Tablet (25 mg total) by mouth Twice per day as needed for up to 90 days    No facility-administered medications prior to visit.        Immunization History:     Immunization History   Administered Date(s) Administered   . Covid-19 Vaccine,Moderna,12 Years+ 09/26/2019, 10/25/2019, 06/11/2020, 12/01/2020   . Influenza Vaccine, 6 month-adult 07/01/2018, 06/11/2019   . PREVNAR 13 (ADMIN) 06/06/2016   . Pneumovax 06/21/2017   . Shingrix - Zoster Vaccine (Admin) 07/14/2017, 10/12/2017         Past Medical History:     Past Medical History:   Diagnosis Date   . Atrial fibrillation (CMS HCC)    . Back pain, chronic    . COVID-19 02/2021   . Duodenal ulcer     hx of: acute, hemorrhage   . GERD (gastroesophageal reflux disease) 01/28/2018   . History of short term memory loss    . Mitral valve prolapse     . 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 Obstructive sleep apnea 01/28/2018   . Palpitations 01/28/2018             Past Surgical History:     Past Surgical History:   Procedure Laterality Date   . COLONOSCOPY  2009   . HX HERNIA REPAIR     . HX VASECTOMY     . LAMINECTOMY               Family History:     Family Medical History:     Problem Relation (Age of Onset)    Aortic Aneurysm Maternal Grandfather    No Known Problems Mother, Father              Social History:   Skyelar Swigart  reports that he has quit smoking. His smoking use included cigarettes. He has a 8.00 pack-year smoking history. He has never used smokeless tobacco. He reports current alcohol use. He reports that he does not use drugs.     Review of Systems: In addition to HPI  Review of Systems   Constitutional: Negative for chills, diaphoresis and fever.   HENT:  Negative for ear pain and nosebleeds.    Eyes: Negative for double vision and discharge.   Respiratory: Negative for hemoptysis.    Cardiovascular: Negative for chest pain and palpitations.   Gastrointestinal: Negative for blood in stool and melena.   Genitourinary: Negative for hematuria.   Musculoskeletal: Negative for myalgias.   Skin: Negative for rash.   Neurological: Negative for tremors, speech change and focal weakness.   Endo/Heme/Allergies: Does not bruise/bleed easily.   Psychiatric/Behavioral: Positive for memory loss. Negative for hallucinations.   All other systems reviewed and are negative.        Objective:   Vitals:    Vitals:    04/20/21 1419   BP: (!) 110/50   Pulse: 45   Temp: 36.3 C (97.4 F)   TempSrc: Thermal Scan   SpO2: 95%   Weight: 71.4 kg (157 lb 6.5 oz)   Height: 1.84 m (6' 0.44")   BMI: 21.13         Wt Readings from Last 3 Encounters:   04/20/21 71.4 kg (157 lb 6.5 oz)   04/15/21 70.5 kg (155 lb 6.8 oz)   07/04/19 70.2 kg (154 lb 12.2 oz)      Body mass index is 21.09 kg/m.  Physical Exam  Vitals and nursing note reviewed.   HENT:       Head: Normocephalic and atraumatic.      Right Ear: External ear normal.      Left Ear: External ear normal.   Eyes:      General: No scleral icterus.     Pupils: Pupils are equal, round, and reactive to light.   Neck:      Thyroid: No thyromegaly.      Vascular: No JVD.      Trachea: No tracheal deviation.   Cardiovascular:      Rate and Rhythm: Normal rate and regular rhythm.      Heart sounds: Normal heart sounds. No murmur heard.  Pulmonary:      Breath sounds: Normal breath sounds. No rales.   Abdominal:      General: Bowel sounds are normal.      Palpations: Abdomen is soft. There is no mass.   Musculoskeletal:         General: No deformity.      Cervical back: Neck supple.   Skin:     General: Skin is warm and dry.   Neurological:      Mental Status: He is alert and oriented to person, place, and time.      Comments: SHORT TERM MEMORY DEFICIT   Psychiatric:         Mood and Affect: Affect normal.         POCT Results:                   I have reviewed and interpreted  the point of care( POCT) results in this note and used this information in my assessment. Interperation: None performed Today  Lucia Bitter, MD  04/20/2021, 14:42      There are no exam notes on file for this visit.    Assessment & Plan:   1. Alzheimer's dementia (CMS HCC)  PATIENT WAS GIVEN A LOW DOSE TEST OF QUETIAPINE 12.5 MG UP TO TWICE A DAY OVER THE PAST FEW DAYS WITH NO EVIDENCE OF ILL EFFECTS. DISCUSSED USING  IT PRN AGGITATION OR RESTLESS NESS. THEY WILL FOLLOW UP WITH NEUROLOGY    2. Paroxysmal atrial fibrillation (CMS  HCC)  CONTINUE METOPROLOL FOR RATE CONTROL AND ELIQUIS AS ANTICOAG AND MONITOR             Health Maintenance   Topic Date Due   . Hepatitis C screening  Never done   . Annual Wellness Visit - Calendar Year [employer]  08/14/2020   . Influenza Vaccine (1) 04/14/2021   . Depression Screening  04/15/2022   . Adult Tdap-Td (2 - Td or Tdap) 01/27/2025   . Shingles Vaccine  Completed   . Covid-19 Vaccine  Completed   .  Pneumococcal Vaccination, Age 5+  Completed   . Meningococcal Vaccine  Aged Out     @ZCOVAX @  Covid-19 Instructions and Precautions  Get the Covid Vaccine as soon as you hear it is available.  It may be available at pharmacies and Health Department before we have it in office.  Stay home and continue to isolate  Use mask whenever outside your home.  Call the office if you are sick to get instructions.  Practice social distancing  Avoid touching eyes, nose, and mouth  Clean and disinfect surfaces  Wash your hands often with soap and water for 20 seconds  Call if any fever, increased SOB, cough or body aches   Return if symptoms worsen or fail to improve.  Return if symptoms worsen or fail to improve.  Return if symptoms worsen or fail to improve, for Follow up.  , MD 04/20/2021 14:42

## 2021-06-06 ENCOUNTER — Other Ambulatory Visit (INDEPENDENT_AMBULATORY_CARE_PROVIDER_SITE_OTHER): Payer: Self-pay | Admitting: Family Medicine

## 2021-06-06 NOTE — Telephone Encounter (Signed)
Medication refill request:    Last scheduled appointment: 04/20/21  Currently scheduled future appointment is:  Last labs: 04/19/21    Patient has been seen within the last year: Yes.    [place] Shaquill Iseman, RN  06/06/2021, 07:43

## 2021-09-04 ENCOUNTER — Other Ambulatory Visit (INDEPENDENT_AMBULATORY_CARE_PROVIDER_SITE_OTHER): Payer: Self-pay | Admitting: Family Medicine

## 2021-09-04 DIAGNOSIS — I480 Paroxysmal atrial fibrillation: Secondary | ICD-10-CM
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
## 2022-02-13 ENCOUNTER — Emergency Department (HOSPITAL_COMMUNITY)

## 2022-02-13 ENCOUNTER — Other Ambulatory Visit: Payer: Self-pay

## 2022-02-13 ENCOUNTER — Encounter (HOSPITAL_COMMUNITY): Payer: Self-pay

## 2022-02-13 ENCOUNTER — Emergency Department
Admission: EM | Admit: 2022-02-13 | Discharge: 2022-02-14 | Disposition: A | Discharge status: Home / Self Care | Attending: Emergency Medicine | Admitting: Emergency Medicine

## 2022-02-13 DIAGNOSIS — R262 Difficulty in walking, not elsewhere classified: Secondary | ICD-10-CM | POA: Insufficient documentation

## 2022-02-13 DIAGNOSIS — R9431 Abnormal electrocardiogram [ECG] [EKG]: Secondary | ICD-10-CM | POA: Insufficient documentation

## 2022-02-13 DIAGNOSIS — F0390 Unspecified dementia without behavioral disturbance: Secondary | ICD-10-CM | POA: Insufficient documentation

## 2022-02-13 DIAGNOSIS — R531 Weakness: Secondary | ICD-10-CM

## 2022-02-13 DIAGNOSIS — J324 Chronic pansinusitis: Secondary | ICD-10-CM

## 2022-02-13 LAB — CBC WITH DIFF
BASOPHIL #: <0.1 10*3/uL (ref <=0.20)
BASOPHIL %: 1 %
EOSINOPHIL #: 0.11 10*3/uL (ref <=0.50)
EOSINOPHIL %: 1 %
HCT: 39 % (ref 38.9–52.0)
HGB: 13.4 g/dL (ref 13.4–17.5)
IMMATURE GRANULOCYTE #: <0.1 10*3/uL (ref <0.10)
IMMATURE GRANULOCYTE %: 0 % (ref 0–1)
LYMPHOCYTE #: 1.01 10*3/uL (ref 1.00–4.80)
LYMPHOCYTE %: 13 %
MCH: 32.5 pg — ABNORMAL HIGH (ref 26.0–32.0)
MCHC: 34.4 g/dL (ref 31.0–35.5)
MCV: 94.7 fL (ref 78.0–100.0)
MONOCYTE #: 0.89 10*3/uL (ref 0.20–1.10)
MONOCYTE %: 12 %
MPV: 10.1 fL (ref 8.7–12.5)
NEUTROPHIL #: 5.64 10*3/uL (ref 1.50–7.70)
NEUTROPHIL %: 73 %
PLATELETS: 211 10*3/uL (ref 150–400)
RBC: 4.12 10*6/uL — ABNORMAL LOW (ref 4.50–6.10)
RDW-CV: 13.1 % (ref 11.5–15.5)
WBC: 7.7 10*3/uL (ref 3.7–11.0)

## 2022-02-13 LAB — COMPREHENSIVE METABOLIC PANEL, NON-FASTING
ALBUMIN: 3.1 g/dL — ABNORMAL LOW (ref 3.4–4.8)
ALKALINE PHOSPHATASE: 43 U/L — ABNORMAL LOW (ref 45–115)
ALT (SGPT): 11 U/L (ref 10–55)
ANION GAP: 9 mmol/L (ref 4–13)
AST (SGOT): 17 U/L (ref 8–45)
BILIRUBIN TOTAL: 0.5 mg/dL (ref 0.3–1.3)
BUN/CREA RATIO: 17 (ref 6–22)
BUN: 14 mg/dL (ref 8–25)
CALCIUM: 9.1 mg/dL (ref 8.8–10.2)
CHLORIDE: 102 mmol/L (ref 96–111)
CO2 TOTAL: 26 mmol/L (ref 23–31)
CREATININE: 0.83 mg/dL (ref 0.75–1.35)
ESTIMATED GFR: 90 mL/min/BSA (ref >=60)
GLUCOSE: 116 mg/dL (ref 65–125)
POTASSIUM: 3.6 mmol/L (ref 3.5–5.1)
PROTEIN TOTAL: 6.5 g/dL (ref 6.0–8.0)
SODIUM: 137 mmol/L (ref 136–145)

## 2022-02-13 LAB — TROPONIN-I: TROPONIN I: <7 ng/L (ref <=30)

## 2022-02-13 NOTE — ED Triage Notes (Signed)
Wife reports new onset of weakness with shuffling gait and overall weakness. Wife reports patient is usually able to walk without difficulty and has good muscle strength.

## 2022-02-14 DIAGNOSIS — R9431 Abnormal electrocardiogram [ECG] [EKG]: Secondary | ICD-10-CM

## 2022-02-14 LAB — URINALYSIS, MACROSCOPIC
BILIRUBIN: NEGATIVE mg/dL
BLOOD: NEGATIVE mg/dL
COLOR: NORMAL
GLUCOSE: NEGATIVE mg/dL
LEUKOCYTES: NEGATIVE WBCs/uL
NITRITE: NEGATIVE
PH: 5.5 (ref 4.6–8.0)
SPECIFIC GRAVITY: 1.028 (ref 1.001–1.035)
UROBILINOGEN: NEGATIVE mg/dL

## 2022-02-14 LAB — URINALYSIS, MICROSCOPIC

## 2022-02-14 LAB — ECG 12-LEAD
Atrial Rate: 77 {beats}/min
Calculated P Axis: 46 degrees
Calculated R Axis: 41 degrees
Calculated T Axis: 62 degrees
PR Interval: 170 ms
QRS Duration: 84 ms
QT Interval: 428 ms
QTC Calculation: 484 ms
Ventricular rate: 77 {beats}/min

## 2022-02-14 NOTE — ED Nurses Note (Signed)
Changed linen/ cleaned pt of incontinence

## 2022-02-14 NOTE — ED Provider Notes (Signed)
Chief complaint:         Weakness    History of present illness:         This is a pleasant 76 year old gentleman who is here with his wife.  She states yesterday afternoon, patient has started to have some weakness.  She states he normally is pretty mobile.  He just started feeling weak.  He states he has some slight difficulty walking.  She was worried he may have possibly had a stroke.  His symptoms occurred greater than 6 hours prior to arrival.  She denied any nausea or vomiting diarrhea.  He does have dementia associated used to help with the history.  She denies any injury.  She denies any other issues whatsoever.  She says she is just concerned as he has been feeling weak and she does not know what is causing it.    Past medical history/past surgical history/social history/medications/allergies:  Per nursing notes briefly reviewed and appreciated by me.    Review of systems:  10 systems reviewed with the patient and his wife.  Pertinent positive mentioned above otherwise negative    Physical exam:         General:  This is a pleasant 76 year old gentleman who appears stated age awake and alert to person, with some dementia noted.         Vitals:  Four vital signs reviewed and appreciated by me and as documented per nursing notes.         HEENT:  Normocephalic, atraumatic, extraocular motion intact.  Lips moist pink without cyanosis.  No exudate or drainage of the posterior pharynx.         Neck:  Supple.  No nuchal rigidity.  No cervical tenderness.  Full range of motion noted.         Heart:  Regular rate and rhythm.  No rubs, clicks, or murmurs.         Lungs:  Clear to auscultation bilaterally.  No rales rhonchi or wheezing.         Abdomen:  Soft, nontender, no rigidity, no guarding, bowel sounds present.  No peritoneal signs.         Back:  No midline tenderness noted.         Extremities:  Intact x 4.  No cyanosis, clubbing, edema appreciated.         Neuro:  Cranial nerves 2-12 grossly intact with no  obvious neurological deficits currently appreciated.         Skin:   Warm, dry, intact         Psych:  Patient is pleasant and cooperative.    EKG reviewed by myself without the aid of a cardiologist does show normal sinus rhythm with a rate 77.  PR interval 170.  QT 428.  QTC 484.  QRS 84.  P axis 46.  QRS axis 41.  T axis 72.  Nonspecific STT wave changes noted.  No previous EKG available for comparison.  I was unable to use previous program for the computer to pull one up.    Emergency department course:         This is a pleasant 76 years of age gentleman that presents to the emergency department.  On presentation, patient was triaged, vitals were obtained, and patient was placed in exam room.  I did personally interviewed and examined the patient.  Above history and physical obtained.  Nursing notes briefly reviewed and appreciated by me as well.  With  the patient's presentation and exam, labs were obtained.  Labs were essentially unremarkable.  EKG as noted.  CT of the head was read by the radiologist "extensive paranasal sinus disease.  Atrophy.  Small vessel ischemic change.  No acute process.".  UA was negative for infection.  At this time I can find no acute abnormalities regarding his workup.  Patient does not appear to be febrile.  He is nontoxic nonlethargic in appearance.  I advised the wife I felt we could let him go home.  He was told to follow up with the primary care physician tomorrow and to return immediately if any worsening condition and as needed.  Patient's wife does express verbal understanding and agreement with this plan.  At time of discharge, patient does appear stable and appropriate for discharge in my opinion.      Clinical Impression   Weakness (Primary)

## 2022-02-14 NOTE — ED Nurses Note (Signed)
PO Fluids given

## 2022-02-15 ENCOUNTER — Ambulatory Visit (INDEPENDENT_AMBULATORY_CARE_PROVIDER_SITE_OTHER): Payer: Self-pay | Admitting: FAMILY PRACTICE

## 2022-02-15 ENCOUNTER — Ambulatory Visit (INDEPENDENT_AMBULATORY_CARE_PROVIDER_SITE_OTHER): Payer: Self-pay | Admitting: Medical

## 2022-02-15 NOTE — Telephone Encounter (Signed)
Patient has been in ED within the past 7 days. Please do ED TOC follow up. Patient has appointment today. Kamaree Wheatley J. Marque Bango, LPN

## 2022-02-15 NOTE — Telephone Encounter (Signed)
ER TOC gathered.  Sela Hilding, Medical Assistant 02/15/2022 10:53

## 2022-02-17 ENCOUNTER — Other Ambulatory Visit: Payer: Self-pay

## 2022-02-17 ENCOUNTER — Ambulatory Visit (INDEPENDENT_AMBULATORY_CARE_PROVIDER_SITE_OTHER): Admitting: Student in an Organized Health Care Education/Training Program

## 2022-02-17 ENCOUNTER — Encounter (INDEPENDENT_AMBULATORY_CARE_PROVIDER_SITE_OTHER): Payer: Self-pay | Admitting: Student in an Organized Health Care Education/Training Program

## 2022-02-17 VITALS — BP 120/65 | HR 95 | Temp 99.2°F | Ht 74.0 in | Wt 153.0 lb

## 2022-02-17 DIAGNOSIS — J329 Chronic sinusitis, unspecified: Secondary | ICD-10-CM

## 2022-02-17 MED ORDER — AMOXICILLIN 875 MG-POTASSIUM CLAVULANATE 125 MG TABLET
1.0000 | ORAL_TABLET | Freq: Two times a day (BID) | ORAL | 0 refills | Status: DC
Start: 2022-02-17 — End: 2022-10-31
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
## 2022-02-17 NOTE — Nursing Note (Signed)
Patient c/o sinus congestion and drainage(greenish yellow) x 5 days. He has taken Tylenol and OTC Mucinex.  [name], RT(R) 02/17/2022 15:55

## 2022-02-23 ENCOUNTER — Ambulatory Visit (INDEPENDENT_AMBULATORY_CARE_PROVIDER_SITE_OTHER): Admitting: Medical

## 2022-02-23 ENCOUNTER — Other Ambulatory Visit: Payer: Self-pay

## 2022-02-23 ENCOUNTER — Encounter (INDEPENDENT_AMBULATORY_CARE_PROVIDER_SITE_OTHER): Payer: Self-pay | Admitting: Medical

## 2022-02-23 VITALS — BP 116/60 | HR 80 | Temp 98.6°F | Wt 152.3 lb

## 2022-02-23 DIAGNOSIS — G4733 Obstructive sleep apnea (adult) (pediatric): Secondary | ICD-10-CM

## 2022-02-23 DIAGNOSIS — I480 Paroxysmal atrial fibrillation: Secondary | ICD-10-CM

## 2022-02-23 DIAGNOSIS — G309 Alzheimer's disease, unspecified: Secondary | ICD-10-CM

## 2022-02-23 DIAGNOSIS — F0280 Dementia in other diseases classified elsewhere without behavioral disturbance: Secondary | ICD-10-CM

## 2022-02-23 DIAGNOSIS — N3281 Overactive bladder: Secondary | ICD-10-CM

## 2022-02-23 DIAGNOSIS — E782 Mixed hyperlipidemia: Secondary | ICD-10-CM
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
## 2022-02-23 NOTE — Nursing Note (Signed)
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 Wants to see Dr Curley Spice, he was booked up/   Wife is concerned about him taking small steps, thinks it may be a depth issue. Would like to discuss this.

## 2022-02-24 ENCOUNTER — Other Ambulatory Visit (INDEPENDENT_AMBULATORY_CARE_PROVIDER_SITE_OTHER): Payer: Self-pay | Admitting: FAMILY PRACTICE

## 2022-02-24 MED ORDER — VENLAFAXINE ER 150 MG CAPSULE,EXTENDED RELEASE 24 HR
150.0000 mg | ORAL_CAPSULE | Freq: Every day | ORAL | 3 refills | Status: AC
Start: 2022-02-24 — End: ?

## 2022-02-24 NOTE — Telephone Encounter (Signed)
Regarding: rx  ----- Message from [name] sent at 02/24/2022  1:41 PM EDT -----  Earlie Server, MD    Pt is out of venlafaxine (EFFEXOR XR) 150 mg Oral Capsule, Sust. Release 24 hr  They are going to be here for another month and [facility] but [facility] are [facility] and need it called [facility].     venlafaxine (EFFEXOR XR) 150 mg Oral Capsule, Sust. Release 24 hr    Preferred Pharmacy     [facility] - [place], [place] - [address]    [address] [place] [address]    Phone: [phone] Fax: [phone]    Hours: Not open 24 hours

## 2022-02-24 NOTE — Telephone Encounter (Signed)
Medication refill request:    Last scheduled appointment: yesterday, 02/23/22  Currently scheduled future appointment is: 02/26/23  Last labs: 02/13/22    Patient has been seen within the last year: Yes.    [place] Delsignore, RN  02/24/2022, 13:44

## 2022-04-01 ENCOUNTER — Other Ambulatory Visit (INDEPENDENT_AMBULATORY_CARE_PROVIDER_SITE_OTHER): Payer: Self-pay | Admitting: Family Medicine

## 2022-04-03 NOTE — Telephone Encounter (Signed)
Last scheduled appointment with you was Visit date 02/23/2022.    Currently scheduled future appointment is Visit date not found.    Patient has been seen within the last year: Yes.    Confirmed preferred pharmacy for this refill encounter is   Preferred Pharmacy       [id number] [place], [place] - [address]    [address] [place] [address]    Phone: [phone] Fax: [phone]    Hours: Not open 24 hours    [id number] - [place], [place] - [address] AT [facility] [address]    [address] [place] [address]    Phone: [phone] Fax: [phone]    Hours: Not open 24 hours    [id number] [place], [place] - [address]    [address] [place] [address]    Phone: [phone] Fax: [phone]    Hours: Not open 24 hours    [facility] - [place], [place] - [address]    [address] [place] [address]    Phone: [phone] Fax: [phone]    Hours: Not open 24 hours        .     Marty Heck, Ambulatory Care Assistant  04/03/2022, 07:40

## 2022-05-22 ENCOUNTER — Ambulatory Visit (INDEPENDENT_AMBULATORY_CARE_PROVIDER_SITE_OTHER): Payer: Self-pay | Admitting: FAMILY PRACTICE

## 2022-05-22 NOTE — Telephone Encounter (Signed)
Regarding: returned call  ----- Message from Lanetta Inch sent at 05/22/2022  3:31 PM EDT -----  Virgel Gess, MD        Pt's wife is returning call to [place].  Wife stating the pt has the infusion for covid, and she is just asking the doctor if it is ok for him to get all 3.  Please call the patient to advise.

## 2022-05-22 NOTE — Telephone Encounter (Signed)
Patients wife wants to know if it is safe for him to get the flu, rsv, and covid vaccine at the same time on Friday? He has an appointment at [facility] to get these.  [name] 05/22/2022 15:32

## 2022-05-22 NOTE — Telephone Encounter (Addendum)
I called and left a voicemail for patient's wife Lyndee Leo to call back. Calling to see what the material she found is that might keep her husband from getting all 3 vaccines.  [place] DelSignore 05/22/2022 15:19        Regarding: Speak to doctor/nurse  ----- Message from Louis Meckel sent at 05/22/2022  3:14 PM EDT -----  Virgel Gess, MD    Pts wife is asking to speak to Dr. Kingsley Spittle or his nurse prior to Friday when her husband is scheduled at [facility] to get his FLU, RSV, COVID on Friday and she was reading some material that might keep her husband from getting all 3 vaccines. Please call back at your earliest convenience.     Thank you, Vania Rea

## 2022-05-23 ENCOUNTER — Ambulatory Visit (INDEPENDENT_AMBULATORY_CARE_PROVIDER_SITE_OTHER): Payer: Self-pay | Admitting: FAMILY PRACTICE

## 2022-05-23 NOTE — Telephone Encounter (Signed)
I called and left a voicemail for patient's wife Lyndee Leo to call back. Calling to inform her that he can have the RSV vaccine even though he has had the covid infusion.  [place] DelSignore 05/23/2022 14:20

## 2022-05-23 NOTE — Telephone Encounter (Signed)
Yes  More like;y to be achy for a few days but wont hurt

## 2022-05-23 NOTE — Telephone Encounter (Signed)
Yes it is!

## 2022-05-23 NOTE — Telephone Encounter (Signed)
Regarding: vaccine  ----- Message from Roxine Caddy sent at 05/23/2022 11:01 AM EDT -----  Pt's wife would like you to call her again. Please call to advise.     ----- Message from Henreitta Leber sent at 05/23/2022 10:18 AM EDT -----  Virgel Gess, MD    Calling to request advice on the RSV vaccine and cautions.

## 2022-05-23 NOTE — Telephone Encounter (Signed)
I called and left a voicemail for patient's wife Lyndee Leo to call back. Calling to inform her that patient can get the 3 vaccines done at that the same time. He may be achy for a few days but it wont hurt to have them all done.  [place] DelSignore 05/23/2022 10:58

## 2022-05-23 NOTE — Telephone Encounter (Signed)
I called and spoke to patients wife [name]. I informed her that he is fine to have the 3 vaccines at the same time and it may make him achy for a few days. She said that was not her question. She said that in 2022 he received the antibody infusion for covid. In the consent form for the RSV vaccine it says that if you have been treated for antibody/monoclonal therapy for COVID you need to consult you doctor before getting the RSV vaccine. She wants to know if it is ok for him to have the RSV vaccine since he had the infusion?  [place] DelSignore 05/23/2022 11:07

## 2022-10-31 ENCOUNTER — Other Ambulatory Visit (INDEPENDENT_AMBULATORY_CARE_PROVIDER_SITE_OTHER): Payer: Self-pay | Admitting: Medical

## 2022-10-31 DIAGNOSIS — N390 Urinary tract infection, site not specified: Secondary | ICD-10-CM

## 2022-10-31 MED ORDER — AMOXICILLIN 875 MG-POTASSIUM CLAVULANATE 125 MG TABLET
1.0000 | ORAL_TABLET | Freq: Two times a day (BID) | ORAL | 0 refills | Status: AC
Start: 2022-10-31 — End: 2022-11-07

## 2022-10-31 NOTE — Progress Notes (Signed)
Called and discussed with pt wife  Will treat with recommended abx, if not improving in 2-3 days advise evaluation    1. UTI (urinary tract infection)  - amoxicillin-pot clavulanate (AUGMENTIN) 875-125 mg Oral Tablet; Take 1 Tablet by mouth Twice daily for 7 days  Dispense: 14 Tablet; Refill: 0

## 2022-11-25 ENCOUNTER — Other Ambulatory Visit (INDEPENDENT_AMBULATORY_CARE_PROVIDER_SITE_OTHER): Payer: Self-pay | Admitting: FAMILY PRACTICE

## 2022-11-27 NOTE — Telephone Encounter (Signed)
Last scheduled appointment with you was Visit date 02/23/2022.    Currently scheduled future appointment is Visit date not found.    Patient has been seen within the last year: Yes.    Confirmed preferred pharmacy for this refill encounter is   Preferred Pharmacy       [id number] [place] - [address]    [address] [place] [address]    Phone: [phone] Fax: [phone]    Hours: Not open 24 hours    [id number] - [place] - [address] AT [facility] [address]    [address] [place] [address]    Phone: [phone] Fax: [phone]    Hours: Not open 24 hours    [id number] [place], [place] - [address]    [address] [place] [address]    Phone: [phone] Fax: [phone]    Hours: Not open 24 hours    [facility] - [place], [place] - [address]    [address] [place] [address]    Phone: [phone] Fax: [phone]    Hours: Not open 24 hours        .     Marty Heck, Ambulatory Care Assistant  11/27/2022, 07:12

## 2023-02-26 ENCOUNTER — Encounter (INDEPENDENT_AMBULATORY_CARE_PROVIDER_SITE_OTHER): Payer: Self-pay | Admitting: Medical

## 2023-02-26 ENCOUNTER — Encounter (INDEPENDENT_AMBULATORY_CARE_PROVIDER_SITE_OTHER): Payer: Self-pay | Admitting: FAMILY PRACTICE

## 2023-02-27 ENCOUNTER — Encounter (INDEPENDENT_AMBULATORY_CARE_PROVIDER_SITE_OTHER): Admitting: Medical

## 2023-02-28 ENCOUNTER — Ambulatory Visit (INDEPENDENT_AMBULATORY_CARE_PROVIDER_SITE_OTHER): Admitting: Medical

## 2023-05-25 ENCOUNTER — Encounter (INDEPENDENT_AMBULATORY_CARE_PROVIDER_SITE_OTHER): Payer: Self-pay

## 2023-09-21 ENCOUNTER — Encounter (INDEPENDENT_AMBULATORY_CARE_PROVIDER_SITE_OTHER): Payer: Self-pay
